# Patient Record
Sex: Female | Born: 1975 | Race: White | Hispanic: No | Marital: Single | Smoking: Never smoker
Health system: Southern US, Community
[De-identification: ages and names within clinical notes are randomized; demographics above are authoritative.]

## PROBLEM LIST (undated history)

## (undated) DIAGNOSIS — R000 Tachycardia, unspecified: Secondary | ICD-10-CM

## (undated) DIAGNOSIS — I498 Other specified cardiac arrhythmias: Secondary | ICD-10-CM

## (undated) DIAGNOSIS — R42 Dizziness and giddiness: Secondary | ICD-10-CM

## (undated) DIAGNOSIS — M1990 Unspecified osteoarthritis, unspecified site: Secondary | ICD-10-CM

## (undated) DIAGNOSIS — M940 Chondrocostal junction syndrome [Tietze]: Secondary | ICD-10-CM

## (undated) DIAGNOSIS — G43909 Migraine, unspecified, not intractable, without status migrainosus: Secondary | ICD-10-CM

## (undated) DIAGNOSIS — G90A Postural orthostatic tachycardia syndrome (POTS): Secondary | ICD-10-CM

## (undated) DIAGNOSIS — I499 Cardiac arrhythmia, unspecified: Secondary | ICD-10-CM

## (undated) DIAGNOSIS — G709 Myoneural disorder, unspecified: Secondary | ICD-10-CM

## (undated) DIAGNOSIS — G4733 Obstructive sleep apnea (adult) (pediatric): Secondary | ICD-10-CM

## (undated) DIAGNOSIS — R0602 Shortness of breath: Secondary | ICD-10-CM

## (undated) DIAGNOSIS — Z9889 Other specified postprocedural states: Secondary | ICD-10-CM

## (undated) DIAGNOSIS — I951 Orthostatic hypotension: Secondary | ICD-10-CM

## (undated) DIAGNOSIS — E669 Obesity, unspecified: Secondary | ICD-10-CM

## (undated) HISTORY — DX: Chondrocostal junction syndrome (tietze): M94.0

## (undated) HISTORY — PX: OTHER SURGICAL HISTORY: SHX169

## (undated) HISTORY — DX: Dizziness and giddiness: R42

## (undated) HISTORY — DX: Obesity, unspecified: E66.9

## (undated) HISTORY — PX: CARPAL TUNNEL RELEASE: SHX101

## (undated) HISTORY — DX: Tachycardia, unspecified: R00.0

## (undated) HISTORY — DX: Migraine, unspecified, not intractable, without status migrainosus: G43.909

## (undated) HISTORY — DX: Obstructive sleep apnea (adult) (pediatric): G47.33

---

## 2002-09-30 ENCOUNTER — Other Ambulatory Visit: Admission: RE | Admit: 2002-09-30 | Discharge: 2002-09-30 | Payer: Self-pay | Admitting: Obstetrics and Gynecology

## 2003-10-21 ENCOUNTER — Other Ambulatory Visit: Admission: RE | Admit: 2003-10-21 | Discharge: 2003-10-21 | Payer: Self-pay | Admitting: Obstetrics and Gynecology

## 2004-01-15 ENCOUNTER — Ambulatory Visit (HOSPITAL_COMMUNITY): Admission: RE | Admit: 2004-01-15 | Discharge: 2004-01-15 | Payer: Self-pay | Admitting: Occupational Therapy

## 2004-11-23 ENCOUNTER — Other Ambulatory Visit: Admission: RE | Admit: 2004-11-23 | Discharge: 2004-11-23 | Payer: Self-pay | Admitting: Obstetrics and Gynecology

## 2004-12-26 ENCOUNTER — Encounter: Admission: RE | Admit: 2004-12-26 | Discharge: 2004-12-26 | Payer: Self-pay | Admitting: Gastroenterology

## 2004-12-28 ENCOUNTER — Encounter: Admission: RE | Admit: 2004-12-28 | Discharge: 2004-12-28 | Payer: Self-pay | Admitting: Gastroenterology

## 2008-02-19 ENCOUNTER — Encounter: Admission: RE | Admit: 2008-02-19 | Discharge: 2008-02-19 | Payer: Self-pay | Admitting: Family Medicine

## 2008-04-23 ENCOUNTER — Encounter: Payer: Self-pay | Admitting: Orthopedic Surgery

## 2008-05-15 ENCOUNTER — Encounter: Payer: Self-pay | Admitting: Orthopedic Surgery

## 2008-06-15 ENCOUNTER — Encounter: Payer: Self-pay | Admitting: Orthopedic Surgery

## 2008-12-17 ENCOUNTER — Ambulatory Visit (HOSPITAL_COMMUNITY): Admission: RE | Admit: 2008-12-17 | Discharge: 2008-12-17 | Payer: Self-pay | Admitting: Gastroenterology

## 2008-12-18 ENCOUNTER — Encounter: Admission: RE | Admit: 2008-12-18 | Discharge: 2008-12-18 | Payer: Self-pay | Admitting: Gastroenterology

## 2008-12-22 ENCOUNTER — Encounter: Admission: RE | Admit: 2008-12-22 | Discharge: 2008-12-22 | Payer: Self-pay | Admitting: Gastroenterology

## 2009-01-15 ENCOUNTER — Encounter: Admission: RE | Admit: 2009-01-15 | Discharge: 2009-01-15 | Payer: Self-pay | Admitting: Gastroenterology

## 2009-02-15 ENCOUNTER — Encounter: Admission: RE | Admit: 2009-02-15 | Discharge: 2009-02-15 | Payer: Self-pay | Admitting: Gastroenterology

## 2009-03-19 ENCOUNTER — Ambulatory Visit (HOSPITAL_COMMUNITY): Admission: RE | Admit: 2009-03-19 | Discharge: 2009-03-19 | Payer: Self-pay | Admitting: Family Medicine

## 2009-04-21 ENCOUNTER — Encounter: Payer: Self-pay | Admitting: Internal Medicine

## 2009-04-30 ENCOUNTER — Encounter: Payer: Self-pay | Admitting: Internal Medicine

## 2009-05-03 ENCOUNTER — Encounter: Payer: Self-pay | Admitting: Internal Medicine

## 2009-05-15 HISTORY — PX: REDUCTION MAMMAPLASTY: SUR839

## 2009-05-31 ENCOUNTER — Encounter: Payer: Self-pay | Admitting: Internal Medicine

## 2009-07-09 ENCOUNTER — Encounter: Payer: Self-pay | Admitting: Internal Medicine

## 2009-07-15 ENCOUNTER — Encounter: Payer: Self-pay | Admitting: Internal Medicine

## 2009-08-03 DIAGNOSIS — I10 Essential (primary) hypertension: Secondary | ICD-10-CM | POA: Insufficient documentation

## 2009-08-04 ENCOUNTER — Ambulatory Visit: Payer: Self-pay | Admitting: Internal Medicine

## 2009-08-04 DIAGNOSIS — I498 Other specified cardiac arrhythmias: Secondary | ICD-10-CM

## 2009-08-11 ENCOUNTER — Telehealth: Payer: Self-pay | Admitting: Internal Medicine

## 2009-08-16 ENCOUNTER — Telehealth: Payer: Self-pay | Admitting: Internal Medicine

## 2009-08-16 ENCOUNTER — Ambulatory Visit: Payer: Self-pay | Admitting: Internal Medicine

## 2009-08-23 ENCOUNTER — Telehealth: Payer: Self-pay | Admitting: Internal Medicine

## 2009-08-27 ENCOUNTER — Ambulatory Visit: Payer: Self-pay

## 2009-09-08 ENCOUNTER — Ambulatory Visit: Payer: Self-pay | Admitting: Internal Medicine

## 2009-09-08 DIAGNOSIS — I951 Orthostatic hypotension: Secondary | ICD-10-CM | POA: Insufficient documentation

## 2009-09-08 DIAGNOSIS — G4733 Obstructive sleep apnea (adult) (pediatric): Secondary | ICD-10-CM

## 2009-09-16 ENCOUNTER — Telehealth: Payer: Self-pay | Admitting: Internal Medicine

## 2009-10-04 ENCOUNTER — Ambulatory Visit: Payer: Self-pay | Admitting: Internal Medicine

## 2009-10-06 ENCOUNTER — Telehealth: Payer: Self-pay | Admitting: Internal Medicine

## 2009-10-15 ENCOUNTER — Encounter: Admission: RE | Admit: 2009-10-15 | Discharge: 2009-10-15 | Payer: Self-pay | Admitting: Obstetrics and Gynecology

## 2009-10-20 ENCOUNTER — Telehealth: Payer: Self-pay | Admitting: Internal Medicine

## 2009-11-24 ENCOUNTER — Ambulatory Visit: Payer: Self-pay | Admitting: Internal Medicine

## 2009-11-24 DIAGNOSIS — R0789 Other chest pain: Secondary | ICD-10-CM | POA: Insufficient documentation

## 2009-11-26 ENCOUNTER — Telehealth: Payer: Self-pay | Admitting: Internal Medicine

## 2009-12-16 ENCOUNTER — Ambulatory Visit (HOSPITAL_COMMUNITY): Admission: RE | Admit: 2009-12-16 | Discharge: 2009-12-17 | Payer: Self-pay | Admitting: Plastic Surgery

## 2009-12-16 ENCOUNTER — Encounter (INDEPENDENT_AMBULATORY_CARE_PROVIDER_SITE_OTHER): Payer: Self-pay | Admitting: Plastic Surgery

## 2010-02-17 ENCOUNTER — Ambulatory Visit: Payer: Self-pay | Admitting: Internal Medicine

## 2010-06-05 ENCOUNTER — Encounter: Payer: Self-pay | Admitting: Gastroenterology

## 2010-06-12 LAB — CONVERTED CEMR LAB
BUN: 14 mg/dL (ref 6–23)
CO2: 28 meq/L (ref 19–32)
Calcium: 9.4 mg/dL (ref 8.4–10.5)
Chloride: 104 meq/L (ref 96–112)
Glucose, Bld: 75 mg/dL (ref 70–99)
Hgb A1c MFr Bld: 5.6 % (ref 4.6–6.5)
Potassium: 4 meq/L (ref 3.5–5.1)
Sodium: 139 meq/L (ref 135–145)

## 2010-06-14 NOTE — Cardiovascular Report (Signed)
Summary: Hand Drawn Heart Pic  Hand Drawn Heart Pic   Imported By: Roderic Ovens 09/06/2009 11:36:41  _____________________________________________________________________  External Attachment:    Type:   Image     Comment:   External Document

## 2010-06-14 NOTE — Assessment & Plan Note (Signed)
Summary: PER CHECK OUT/SF   Referring Provider:  Michele Rockers, MD Primary Provider:  Dr Clovis Riley   History of Present Illness: The patient presents today for routine electrophysiology followup. She reports doing very well since last being seen in our clinic.  Her unsteadiness and dizziness have resolved with fludricordisone.  Sinus tachycardia is also better.  She reports rare sharp chest discomfort  lasting only several seconds at rest.  The patient denies symptoms of shortness of breath, orthopnea, PND, lower extremity edema, presyncope, syncope, or neurologic sequela.  Her daytime somnolence and fatigue and signicicantly improved with CPAP.  The patient is tolerating medications without difficulties and is otherwise without complaint today.   Current Medications (verified): 1)  Fludrocortisone Acetate 0.1 Mg Tabs (Fludrocortisone Acetate) .... Take One Twice Daily 2)  Cymbalta 30 Mg Cpep (Duloxetine Hcl) .Marland Kitchen.. 1 Capsule  Once Daily 3)  Depo-Provera 150 Mg/ml Susp (Medroxyprogesterone Acetate) .... Uad 4)  Carvedilol 12.5 Mg Tabs (Carvedilol) .... Take One Tablet By Mouth Every Night 5)  Anaprox Ds 550 Mg Tabs (Naproxen Sodium) .... Two Times A Day 6)  Hydrocodone-Acetaminophen 5-325 Mg Tabs (Hydrocodone-Acetaminophen) .... As Needed 7)  Stool Softener 100 Mg Caps (Docusate Sodium) .... Uad 8)  Vita-Plus E 400 Unit Caps (Vitamin E) .... Once Daily  Allergies: 1)  ! * Illison 2)  ! Toradol  Past History:  Past Medical History: Reviewed history from 09/08/2009 and no changes required. Migraines costochondritis sinus tachycardia obesity orthostatic dizziness mild OSA  Social History: Reviewed history from 08/04/2009 and no changes required. Pt lives in [place] with spouse.  She has no children. Works at the [employer] for [employer]. Tobacco Use - No.  Alcohol Use - rare Regular Exercise - no Drug Use - no  Review of Systems       All systems are reviewed and  negative except as listed in the HPI.   Vital Signs:  Patient profile:   35 year old female Height:      68 inches Weight:      264 pounds BMI:     40.29 Pulse rate:   66 / minute BP sitting:   148 / 86  (left arm)  Vitals Entered By: Laurance Flatten CMA (November 24, 2009 1:50 PM)  Physical Exam  General:  obese, NAD Head:  normocephalic and atraumatic Eyes:  PERRLA/EOM intact; conjunctiva and lids normal. Mouth:  Teeth, gums and palate normal. Oral mucosa normal. Neck:  Neck supple, no JVD. No masses, thyromegaly or abnormal cervical nodes. Lungs:  Clear bilaterally to auscultation and percussion. Heart:  Non-displaced PMI, chest non-tender; regular rate and rhythm, S1, S2 without murmurs, rubs or gallops. Carotid upstroke normal, no bruit. Normal abdominal aortic size, no bruits. Femorals normal pulses, no bruits. Pedals normal pulses. No edema, no varicosities. Abdomen:  Bowel sounds positive; abdomen soft and non-tender without masses, organomegaly, or hernias noted. No hepatosplenomegaly. Msk:  Back normal, normal gait. Muscle strength and tone normal. Pulses:  pulses normal in all 4 extremities Extremities:  No clubbing or cyanosis.  no edema Neurologic:  Alert and oriented x 3.   Impression & Recommendations:  Problem # 1:  SINUS TACHYCARDIA (ICD-427.89) stable no changes today  Problem # 2:  ORTHOSTATIC HYPOTENSION (ICD-458.0) resolved with fludrocortisone we will wean fludrocortisone in the fall if she continues to do well  Problem # 3:  HYPERTENSION (ICD-401.9) stable I would prefer that she be mildly hypertensive than symptomatically hypotensive at this time no changes  today  Problem # 4:  OBSTRUCTIVE SLEEP APNEA (ICD-327.23) symptoms have significantly improved with CPAP  Problem # 5:  CHEST PAIN, ATYPICAL (ICD-786.59) stable she has breast augmentation scheduled.  I would recommend that she proceed to surgery on her current medicines without further cardiac  evaluation.  Patient Instructions: 1)  Your physician recommends that you schedule a follow-up appointment in: 3 months with Dr Johney Frame Prescriptions: FLUDROCORTISONE ACETATE 0.1 MG TABS (FLUDROCORTISONE ACETATE) Take one twice daily  #60 x 3   Entered by:   Dennis Bast, RN, BSN   Authorized by:   Hillis Range, MD   Signed by:   Dennis Bast, RN, BSN on 11/24/2009   Method used:   Electronically to        [facility], [facility] (retail)       [address]       [place], [place]  [address]       Ph: [phone]       Fax: [phone]   RxID:   [id number]

## 2010-06-14 NOTE — Assessment & Plan Note (Signed)
Summary: 2:45/per check out/sf   Visit Type:  Follow-up Referring Provider:  Michele Rockers, MD Primary Provider:  Dr Clovis Riley   History of Present Illness: The patient presents today for routine electrophysiology followup. She reports doing reasonably well since last being seen in our clinic.  Her unsteadiness and dizziness have significantly improved.  Sinus tachycardia is also better.  She reports rare sharp chest discomfort  lasting only several seconds at rest.  The patient denies symptoms of shortness of breath, orthopnea, PND, lower extremity edema, presyncope, syncope, or neurologic sequela. The patient is tolerating medications without difficulties and is otherwise without complaint today.   Current Medications (verified): 1)  Imipramine Hcl 25 Mg Tabs (Imipramine Hcl) .... At Bedtime 2)  Fludrocortisone Acetate 0.1 Mg Tabs (Fludrocortisone Acetate) .... Take One Twice Daily 3)  Cymbalta 30 Mg Cpep (Duloxetine Hcl) .Marland Kitchen.. 1 Capsule  Once Daily 4)  Depo-Provera 150 Mg/ml Susp (Medroxyprogesterone Acetate) .... Uad  Allergies (verified): 1)  ! * Illison 2)  ! Toradol  Past History:  Past Medical History: Reviewed history from 09/08/2009 and no changes required. Migraines costochondritis sinus tachycardia obesity orthostatic dizziness mild OSA  Past Surgical History: Reviewed history from 08/04/2009 and no changes required. bladder stem shortening at age 73  Social History: Reviewed history from 08/04/2009 and no changes required. Pt lives in [place] with spouse.  She has no children. Works at the [employer] for [employer]. Tobacco Use - No.  Alcohol Use - rare Regular Exercise - no Drug Use - no  Review of Systems       All systems are reviewed and negative except as listed in the HPI.   Vital Signs:  Patient profile:   35 year old female Height:      68 inches Weight:      269 pounds BMI:     41.05 Pulse rate:   94 / minute BP sitting:   98 / 72   (left arm)  Vitals Entered By: Laurance Flatten CMA (Oct 04, 2009 3:15 PM)  Physical Exam  General:  obese, NAD Head:  normocephalic and atraumatic Eyes:  PERRLA/EOM intact; conjunctiva and lids normal. Mouth:  Teeth, gums and palate normal. Oral mucosa normal. Neck:  Neck supple, no JVD. No masses, thyromegaly or abnormal cervical nodes. Lungs:  Clear bilaterally to auscultation and percussion. Heart:  Non-displaced PMI, chest non-tender; regular rate and rhythm, S1, S2 without murmurs, rubs or gallops. Carotid upstroke normal, no bruit. Normal abdominal aortic size, no bruits. Femorals normal pulses, no bruits. Pedals normal pulses. No edema, no varicosities. Abdomen:  Bowel sounds positive; abdomen soft and non-tender without masses, organomegaly, or hernias noted. No hepatosplenomegaly. Msk:  Back normal, normal gait. Muscle strength and tone normal. Pulses:  pulses normal in all 4 extremities Extremities:  No clubbing or cyanosis.  no edema Neurologic:  Alert and oriented x 3. Skin:  Intact without lesions or rashes. Psych:  Normal affect.   EKG  Procedure date:  10/04/2009  Findings:      sinus rhythm 94 bpm, otherwise normal ekg  Impression & Recommendations:  Problem # 1:  ORTHOSTATIC HYPOTENSION (ICD-458.0) improving with florinef no changes today supportive care encouraged  Problem # 2:  SINUS TACHYCARDIA (ICD-427.89) stable decrease coreg to 12.5mg  at night could switch to nadolol if sinus tach continues to be a problem  Problem # 3:  HYPERTENSION (ICD-401.9) stable  Patient Instructions: 1)  Your physician recommends that you schedule a follow-up appointment in: 2  months with Dr Johney Frame 2)  Your physician has recommended you make the following change in your medication: decrease Coreg TO 12.5MG  at night  Appended Document: 2:45/per check out/sf Pt to follow-up with Dr Anne Fu for results of recent sleep study weight loss is encouraged

## 2010-06-14 NOTE — Assessment & Plan Note (Signed)
Summary: nep/tachycardia/jml   Visit Type:  Initial Consult Referring Provider:  Michele Rockers, MD Primary Provider:  Dr Clovis Riley  CC:  tachycardia.  History of Present Illness: Julia Haas is a pleasant 35 yo WF with a h/o sinus tachycardia who presents today for EP consultation. She reports doing well until 11/10 when she began to notice "heart racing" with associated SOB and fatigue.  She went to urgent care and was found to have sinus tachycardia and an elevated BP.  She subsequently has been evaluated by Dr Anne Fu.  Per Dr Anne Fu office record, she had a chesgt CT which  did not show PTE.  She also had an echocardiogram 04/21/09 which revealed showed normal LV function without any significant abnormalities.  She had a holter monitor placed 04/21/09-04/23/09 which revealed sinus rhythm with frequent sinus tachycardia.  Her average HR was 103 (range was 86-156 bpm).  No sustained arrhythmias were observed.  She has been treated with verapamil 240mg  daily as well as coreg 25mg  two times a day.  Her blood pressure has improved, though she continues to have sinus tachycardia.  She has also tried diltiazem DC 240mg  daily and metoprolol 50mg  two times a day.  She reports dry throat with metoprolol and daytime somnolence.  The diltiazem caused headache and fatigue.  She has stopped taking imipramine (which she took for migraines) without improvement. Presently, she reports feeling reasonably well.  She has nasal congestion and symptoms of a URI.  She continues to have fatigue and palpitations with activity.  She also feels a sharp lancenating pain in her chest when her heart rate is fast.   She reports occasional dizziness, worse when she rises from a  seated position. She also reports SOB primarliy when bending over. She does not feel rested in the morning and is sleepy throughout the day.  She snores, though her spouse is unaware of sleep apnea. The patient denies symptoms of orthopnea, PND, lower extremity  edema, presyncope, syncope, or neurologic sequela. The patient is tolerating medications without difficulties and is otherwise without complaint today.   Current Medications (verified): 1)  Verapamil Hcl Cr 240 Mg Xr24h-Cap (Verapamil Hcl) .... Take One Capsule By Mouth Daily 2)  Coreg Cr 40 Mg Xr24h-Cap (Carvedilol Phosphate) .... Take One Tablet By Mouth Twice Daily. 3)  Amoxicillin 875 Mg Tabs (Amoxicillin) .... Two Times A Day  Allergies (verified): 1)  ! * Illison 2)  ! 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  She has no children. Works at the [employer] for [employer]. Tobacco Use - No.  Alcohol Use - rare Regular Exercise - no Drug Use - no  Review of Systems       All systems are reviewed and negative except as listed in the HPI.   Vital Signs:  Patient profile:   35 year old female Height:      68 inches Weight:      252 pounds BMI:     38.45 Pulse rate:   84 / minute BP sitting:   96 / 60  (left arm)  Vitals Entered By: Laurance Flatten CMA (August 04, 2009 11:33 AM)  Serial  Vital Signs/Assessments:  Time      Position  BP       Pulse  Resp  Temp     By           Lying RA  100/76   81                    Jewel Hardy CMA           Sitting   101/72   0                     Jewel Hardy CMA           Standing  107/77   0                     Jewel Hardy CMA           Standing  101/77   92                    Jewel Hardy CMA  Comments: pulse #2 - 83 pulse #3 - 88  During reading #5 pt became dizzy and was unable to remain standing. By: Laurance Flatten CMA    Physical Exam  General:  obese, NAD Head:  normocephalic and  atraumatic Eyes:  PERRLA/EOM intact; conjunctiva and lids normal. Mouth:  Teeth, gums and palate normal. Oral mucosa normal. Neck:  Neck supple, no JVD. No masses, thyromegaly or abnormal cervical nodes. Lungs:  Clear bilaterally to auscultation and percussion. Heart:  Non-displaced PMI, chest non-tender; regular rate and rhythm, S1, S2 without murmurs, rubs or gallops. Carotid upstroke normal, no bruit. Normal abdominal aortic size, no bruits. Femorals normal pulses, no bruits. Pedals normal pulses. No edema, no varicosities. Abdomen:  Bowel sounds positive; abdomen soft and non-tender without masses, organomegaly, or hernias noted. No hepatosplenomegaly. Msk:  Back normal, normal gait. Muscle strength and tone normal. Pulses:  pulses normal in all 4 extremities Extremities:  No clubbing or cyanosis. Neurologic:  Alert and oriented x 3. Skin:  Intact without lesions or rashes. Cervical Nodes:  no significant adenopathy Psych:  Normal affect.   Echocardiogram  Procedure date:  04/21/2009  Findings:       Normal LV size and function. There is no gross regional wall motion abnormalities, although sensitivity is reduced by difficult windows. Left ventricular ejection fraction estimated by 2D at 60-65 percent.  LA 3.3cm  Donato Schultz, MD  CT Scan  Procedure date:  02/15/2009  Findings:        Findings:  No filling defects in the pulmonary arteries to suggest   pulmonary emboli. Lungs are clear.  No focal airspace opacities or   suspicious nodules.  No effusions. Heart is normal size. Aorta is   normal caliber. No mediastinal, hilar, or axillary adenopathy.   Visualized thyroid and chest wall soft tissues unremarkable.    Imaging into the upper abdomen shows no acute findings.    No acute bony abnormality.    Review of the MIP images confirms the above findings.    IMPRESSION:   No evidence of pulmonary embolus.    No acute findings.    Read By:  Charlett Nose,   M.D.     Impression & Recommendations:  Problem # 1:  SINUS TACHYCARDIA (ICD-427.89) The patient has symptomatic sinus tachycardia.  I think that her sinus tachycardia is likely a secondary process, and is unlikely primary inappropriate sinus tachycardia.  She brings a log of recent heart rates, which are mostly < 100 bpm.  Her main complaint today is fatigue and also daytime somnolence.  She snores and does not feel well rested in the AM.  I have encouraged her to follow-up with Dr Anne Fu for a sleep study referral. In addition, she becomes dizzy upon standing and has HR elevation when checking orthostatics.  I think that chronic dehydration could be contributing to her symptoms.  I have recommended salt liberalization with aggressive hydration.  If her heart rates remain less than 100 for most of the time, then we will consider weaning coreg upon return.  Problem # 2:  HYPERTENSION (ICD-401.9) presently well controlled with verapamil and coreg weight loss and lifestyle modifcation would be helpful.  Her updated medication list for this problem includes:    Verapamil Hcl Cr 240 Mg Xr24h-cap (Verapamil hcl) .Marland Kitchen... Take one capsule by mouth daily    Coreg Cr 40 Mg Xr24h-cap (Carvedilol phosphate) .Marland Kitchen... Take one tablet by mouth twice daily.  Patient Instructions: 1)  Your physician recommends that you schedule a follow-up appointment in: 3 weeks with Dr Johney Frame 2)  Call Dr Anne Fu office to set up sleep study 3)  Stay hydrated and increase SALT intake

## 2010-06-14 NOTE — Progress Notes (Signed)
Summary: dizzy/fell  Phone Note Call from Patient Call back at Home Phone [phone]   Caller: Patient Reason for Call: Talk to Doctor Summary of Call: pt is still very dizzy headed and has fell twice this weekend, drinking 60-75 oz daily Initial call taken by: Migdalia Dk,  August 16, 2009 8:09 AM  Follow-up for Phone Call        [facility] for pt to call me back Dennis Bast, RN, BSN  August 16, 2009 8:47 AM will come in today and discuss with Dr Johney Frame Dennis Bast, RN, BSN  August 16, 2009 12:11 PM

## 2010-06-14 NOTE — Progress Notes (Signed)
Summary: steroid/heart rate  Phone Note Call from Patient Call back at Home Phone [phone]   Caller: Patient Reason for Call: Talk to Nurse Summary of Call: thinks that the steroid is not helping, still swimmy headed, elevated heart rate Initial call taken by: Migdalia Dk,  August 23, 2009 8:15 AM  Follow-up for Phone Call        spoke with pt and she feels she is about the same. HR still up Drinking lots of fluids and eatting lots of salt taking Florinef 0.1mg  two times a day.  When she is sitting she feels a little swimmy headed.  When up moving around it's the worse  "feels like she is on a boat and the waves are moving her." Pt calling back request call Judie Grieve  August 26, 2009 1:24 PM  PER PT CALLED BACK TO [place] WITH KELLY [phone] Lorne Skeens  August 26, 2009 4:50 PM   Additional Follow-up for Phone Call Additional follow up Details #1::        Will add on to nurse room for orthostatic BP check Pam aware.  If still orthostatic will increase Forinef to .2mg  two times a day and take for at least 10 days before she calls me to tell me how she is feeling.  If still not any better will refer to Neuro per Dr Johney Frame Dennis Bast, RN, BSN  August 26, 2009 6:24 PM Called pt on her cell phone and [facility] [phone] Dennis Bast, RN, BSN  August 26, 2009 6:28 PM

## 2010-06-14 NOTE — Progress Notes (Signed)
Summary: pt wants to discuss surgery she needs   Phone Note Call from Patient Call back at Home Phone [phone]   Caller: Patient Reason for Call: Talk to Nurse, Talk to Doctor Summary of Call: per pt call pt needs surgery and the MD that is doing it wants to get her b/p under control and she want sto discuss that with you. Number provided is long distnace you have to put the 336 in front. Initial call taken by: Omer Jack,  October 20, 2009 12:06 PM  Follow-up for Phone Call        Pt calling back regarding taking a b/p medication Judie Grieve  October 22, 2009 8:05 AM  pt request call back asap, states this is very important, Migdalia Dk  October 22, 2009 8:23 AM   Additional Follow-up for Phone Call Additional follow up Details #1::        I spoke with the pt and she is scheduled for a breast reduction in July with Dr Etter Sjogren.  Dr Odis Luster said he would not do surgery until her BP is under better control.  The pt does monitor her BP at home and her readings this week were  119/94, HR 83, 141/106, HR 104 and 124/94, HR 81.  The pt would like to know what needs to be done for her BP so that she can have her surgery.  Tresa Endo spoke with Dr Johney Frame about this pt and left a message for the pt to call back.  Additional Follow-up by: Julieta Gutting, RN, BSN,  October 22, 2009 3:29 PM    Additional Follow-up for Phone Call Additional follow up Details #2::    End of July for surgery.  Let her know that Dr Johney Frame will call Dr Odis Luster after he returns from vacation.  She is fine with that and knows Dr Johney Frame is out all week. Dennis Bast, RN, BSN  October 25, 2009 9:52 AM  Additional Follow-up for Phone Call Additional follow up Details #3:: Details for Additional Follow-up Action Taken: we can dictate a letter to Dr Odis Luster or call his office.  Sent Dr Johney Frame a remionder to call Dr Odis Luster regarding pr and her upcoming surgery Dennis Bast, RN, BSN  November 22, 2009 9:36 AM

## 2010-06-14 NOTE — Progress Notes (Signed)
Summary: dizziness/med change needed?  Phone Note Call from Patient Call back at Home Phone [phone]   Caller: Patient Reason for Call: Talk to Nurse Summary of Call: Patient is experiencing dizziness.  Med change? Initial call taken by: Burnard Leigh,  August 11, 2009 8:45 AM  Follow-up for Phone Call        started Sun. this is new.  ? d/cing one of the BP meds  Discussed with Dr Johney Frame pt to decrease Coreg to 20mg  daily and increase fluids and salt and keep follow up appointment.  She understands and will call if having more problems Dennis Bast, RN, BSN  August 11, 2009 1:11 PM

## 2010-06-14 NOTE — Progress Notes (Signed)
Summary: mammogram  Phone Note Call from Patient Call back at Home Phone [phone]   Caller: Patient Reason for Call: Talk to Nurse Summary of Call: calling to see if appt has been set up for mammogram Initial call taken by: Migdalia Dk,  Oct 06, 2009 3:16 PM  Follow-up for Phone Call        [facility] w Dr Lynnell Dike nurse Amy Chestine Spore.  please call patient to set up a mamogram or appoinment for this patient regarding fibrocystic breast per Dr Johney Frame Dennis Bast, RN, BSN  Oct 07, 2009 3:43 PM

## 2010-06-14 NOTE — Assessment & Plan Note (Signed)
Summary: APPT. AT 0915A/OK PER JAMIE/D.MILLER   Referring Provider:  Michele Rockers, MD Primary Provider:  Dr Clovis Riley   History of Present Illness: The patient presents today for routine electrophysiology followup. She reports doing reasonably well since last being seen in our clinic.  Her unsteadiness and dizziness have improved.  She continues to have sinus tachycardia with activity.  She reports rare dull chest discomfort when her heart rates are elevated.  The patient denies symptoms of shortness of breath, orthopnea, PND, lower extremity edema, presyncope, syncope, or neurologic sequela. The patient is tolerating medications without difficulties and is otherwise without complaint today.   Current Medications (verified): 1)  Verapamil Hcl Cr 240 Mg Xr24h-Cap (Verapamil Hcl) .... Take One Capsule By Mouth Daily 2)  Carvedilol 25 Mg Tabs (Carvedilol) .... Take One Tablet By Mouth Once Daily 3)  Imipramine Hcl 25 Mg Tabs (Imipramine Hcl) .... At Bedtime 4)  Fludrocortisone Acetate 0.1 Mg Tabs (Fludrocortisone Acetate) .... Take One Twice Daily  Allergies (verified): 1)  ! * Illison 2)  ! Toradol  Past History:  Past Medical History: Migraines costochondritis sinus tachycardia obesity orthostatic dizziness mild OSA  Past Surgical History: Reviewed history from 08/04/2009 and no changes required. bladder stem shortening at age 63  Social History: Reviewed history from 08/04/2009 and no changes required. Pt lives in [place] with spouse.  She has no children. Works at the [employer] for [employer]. Tobacco Use - No.  Alcohol Use - rare Regular Exercise - no Drug Use - no  Review of Systems       All systems are reviewed and negative except as listed in the HPI.   Vital Signs:  Patient profile:   35 year old female Height:      68 inches Weight:      260 pounds BMI:     39.68 Pulse rate:   85 / minute BP sitting:   100 / 80  (left arm)  Vitals Entered By:  Laurance Flatten CMA (September 08, 2009 9:01 AM)  Physical Exam  General:  obese, NAD Head:  normocephalic and atraumatic Eyes:  PERRLA/EOM intact; conjunctiva and lids normal. Mouth:  Teeth, gums and palate normal. Oral mucosa normal. Neck:  Neck supple, no JVD. No masses, thyromegaly or abnormal cervical nodes. Lungs:  Clear bilaterally to auscultation and percussion. Heart:  Non-displaced PMI, chest non-tender; regular rate and rhythm, S1, S2 without murmurs, rubs or gallops. Carotid upstroke normal, no bruit. Normal abdominal aortic size, no bruits. Femorals normal pulses, no bruits. Pedals normal pulses. No edema, no varicosities. Abdomen:  Bowel sounds positive; abdomen soft and non-tender without masses, organomegaly, or hernias noted. No hepatosplenomegaly. Msk:  Back normal, normal gait. Muscle strength and tone normal. Pulses:  pulses normal in all 4 extremities Extremities:  No clubbing or cyanosis. Neurologic:  Alert and oriented x 3. Skin:  Intact without lesions or rashes. Psych:  Normal affect.   Impression & Recommendations:  Problem # 1:  SINUS TACHYCARDIA (ICD-427.89) stable will reduce verapamil to 120mg  daily and then stop if rhythm is stable will consider switching coreg to nadolol upon return  Problem # 2:  ORTHOSTATIC HYPOTENSION (ICD-458.0) improving with florinef I have encouraged pt to stop imipramine consider increasing florinef upon return  Problem # 3:  OBSTRUCTIVE SLEEP APNEA (ICD-327.23) newly diagnosed sleep apnea weight loss advised she will have CPAP trial through Dr Anne Fu office.  Patient Instructions: 1)  Your physician recommends that you schedule a follow-up appointment in:  4 weeks. 2)  Your physician recommends that you continue on your current medications as directed. Please refer to the Current Medication list given to you today.

## 2010-06-14 NOTE — Assessment & Plan Note (Signed)
Summary: Add-on orthostatic hypotension/kl   Current Medications (verified): 1)  Verapamil Hcl Cr 240 Mg Xr24h-Cap (Verapamil Hcl) .... Take One Capsule By Mouth Daily 2)  Carvedilol 25 Mg Tabs (Carvedilol) .... Take One Tablet By Mouth Once Daily 3)  Imipramine Hcl 25 Mg Tabs (Imipramine Hcl) .... At Bedtime  Allergies: 1)  ! * Illison 2)  ! Toradol  Past History:  Past Medical History: Last updated: 08/04/2009 Migraines costochondritis sinus tachycardia obesity  Vital Signs:  Patient profile:   35 year old female Height:      68 inches Weight:      264 pounds BMI:     40.29 Pulse rate:   75 / minute Pulse (ortho):   100 / minute BP standing:   114 / 92  Vitals Entered By: Laurance Flatten CMA (August 16, 2009 2:18 PM)  Serial Vital Signs/Assessments:  Time      Position  BP       Pulse  Resp  Temp     By           Lying LA  110/90   86                    Jewel Hardy CMA           Sitting   122/96   92                    Jewel Hardy CMA           Standing  114/92   100                   Jewel Hardy CMA           Standing  110/90   97                    Jewel Hardy CMA           Standing  100/80   104                   Laurance Flatten CMA    Patient Instructions: 1)  Your physician recommends that you schedule a follow-up appointment in: as scheduled. 2)  Your physician has recommended you make the following change in your medication: Florinef 0.1mg  twice a day Prescriptions: FLUDROCORTISONE ACETATE 0.1 MG TABS (FLUDROCORTISONE ACETATE) Take one twice daily  #60 x 3   Entered by:   Lisabeth Devoid RN   Authorized by:   Hillis Range, MD   Signed by:   Lisabeth Devoid RN on 08/16/2009   Method used:   Electronically to        [facility] (retail)       [address]       [place], [place]  [address]       Ph: [phone]       Fax: [phone]   RxID:   [id number]   Appended Document: Add-on orthostatic hypotension/kl Pt presented today  for a nurse visit.  She remains orthostatic by HR and BP despite increased salt and water intake.  She has significant symptoms of orthostasis.  I have therefore recommended florinef 0.1mg  two times a day.  I will see her in follow-up as previously scheduled.  We will consider further testing pending how she is feeling at that time.

## 2010-06-14 NOTE — Letter (Signed)
[facility]  [facility] Progress Note   Imported By: Roderic Ovens 09/06/2009 11:36:11  _____________________________________________________________________  External Attachment:    Type:   Image     Comment:   External Document

## 2010-06-14 NOTE — Assessment & Plan Note (Signed)
Summary: per check otu/sf   Visit Type:  Follow-up Referring Provider:  Michele Rockers, MD Primary Provider:  Dr Clovis Riley   History of Present Illness: The patient presents today for routine electrophysiology followup. She reports doing very well since last being seen in our clinic.  Her unsteadiness and dizziness have resolved with fludricordisone.  Sinus tachycardia is also better.  Her chest pain resolved with recent breast augmentation. The patient denies symptoms of shortness of breath, orthopnea, PND, lower extremity edema, presyncope, syncope, or neurologic sequela.  Her daytime somnolence and fatigue and signicicantly improved with CPAP.   She continues to have trouble with weight loss. The patient is tolerating medications without difficulties and is otherwise without complaint today.   Current Medications (verified): 1)  Fludrocortisone Acetate 0.1 Mg Tabs (Fludrocortisone Acetate) .... Take One Twice Daily 2)  Cymbalta 30 Mg Cpep (Duloxetine Hcl) .Marland Kitchen.. 1 Capsule  Once Daily 3)  Depo-Provera 150 Mg/ml Susp (Medroxyprogesterone Acetate) .... Uad 4)  Carvedilol 12.5 Mg Tabs (Carvedilol) .... Take One Tablet By Mouth Every Night 5)  Stool Softener 100 Mg Caps (Docusate Sodium) .... Uad 6)  Advil 200 Mg Tabs (Ibuprofen) .... Uad  Allergies (verified): 1)  ! * Illison 2)  ! Toradol  Past History:  Past Medical History: Reviewed history from 09/08/2009 and no changes required. 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 Pt lives in [place] with spouse.  She has no children. Works at the [employer] for [employer]. Tobacco Use - No.  Alcohol Use - rare Regular Exercise - no Drug Use - no  Review of Systems       All systems are reviewed and negative except  as listed in the HPI.   Vital Signs:  Patient profile:   35 year old female Height:      68 inches Weight:      265 pounds BMI:     40.44 Pulse rate:   80 / minute BP sitting:   140 / 102  (left arm)  Vitals Entered By: Laurance Flatten CMA (February 17, 2010 1:53 PM)  Physical Exam  General:  Well developed, well nourished, in no acute distress. Head:  normocephalic and atraumatic Eyes:  PERRLA/EOM intact; conjunctiva and lids normal. Mouth:  Teeth, gums and palate normal. Oral mucosa normal. Neck:  Neck supple, no JVD. No masses, thyromegaly or abnormal cervical nodes. Lungs:  Clear bilaterally to auscultation and percussion. Heart:  Non-displaced PMI, chest non-tender; regular rate and rhythm, S1, S2 without murmurs, rubs or gallops. Carotid upstroke normal, no bruit. Normal abdominal aortic size, no bruits. Femorals normal pulses, no bruits. Pedals normal pulses. No edema, no varicosities. Abdomen:  Bowel sounds positive; abdomen soft and non-tender without masses, organomegaly, or hernias noted. No hepatosplenomegaly. Msk:  Back normal, normal gait. Muscle strength and tone normal. Pulses:  pulses normal in all 4 extremities Extremities:  No clubbing or cyanosis. Neurologic:  Alert and oriented x 3.   EKG  Procedure date:  02/17/2010  Findings:      sinus rhythm 80 bpm, nl ekg  Impression & Recommendations:  Problem # 1:  CHEST PAIN, ATYPICAL (ICD-786.59) resolved s/p breast augmentation  Problem # 2:  ORTHOSTATIC HYPOTENSION (ICD-458.0) improved decrease florinef to 0.1mg  daily  Problem # 3:  HYPERTENSION (ICD-401.9) decrease flornef as above  Problem # 4:  SINUS TACHYCARDIA (ICD-427.89) continue coreg consider switching to once daily nadolol if stable  Problem # 5:  OBSTRUCTIVE SLEEP APNEA (ICD-327.23) continue cpap  the importance of weight loss was discussed with pt today  Patient Instructions: 1)  Your physician recommends that you schedule a follow-up  appointment in: 4 months with Dr Johney Frame 2)  Your physician has recommended you make the following change in your medication: Decrease Fludrocortisone 0.1mg  to one tablet daily  3)  Your physician recommends that you return for lab work in today Prescriptions: CARVEDILOL 12.5 MG TABS (CARVEDILOL) Take one tablet by mouth EVERY NIGHT  #30 x 11   Entered by:   Dennis Bast, RN, BSN   Authorized by:   Hillis Range, MD   Signed by:   Dennis Bast, RN, BSN on 02/17/2010   Method used:   Electronically to        [facility] (retail)       [address]       [place], [place]  [address]       Ph: [phone]       Fax: [phone]   RxID:   [id number]   Appended Document: per check otu/sf we will check BMET and A1C today

## 2010-06-14 NOTE — Letter (Signed)
[facility] Office Note  [facility] Office Note   Imported By: Roderic Ovens 09/06/2009 11:35:40  _____________________________________________________________________  External Attachment:    Type:   Image     Comment:   External Document

## 2010-06-14 NOTE — Progress Notes (Signed)
Summary: B/P high  Phone Note Call from Patient Call back at Home Phone [phone]   Caller: Patient Summary of Call: Pt calling regarding her b/p 135/92 last night and today 132/103 both took in left arm Initial call taken by: Judie Grieve,  Sep 16, 2009 10:52 AM  Follow-up for Phone Call        128/98 HR 90 feels ok just worried what will she do when she stops the Verapamil all together.  Will discuss with Dr Johney Frame and call the pt back. Dennis Bast, RN, BSN  Sep 16, 2009 11:09 AM  returning call, Migdalia Dk  Sep 17, 2009 12:07 PM  spoke with Dr Johney Frame not worried about those BP's  continue with plan Dennis Bast, RN, BSN  Sep 17, 2009 2:17 PM

## 2010-06-14 NOTE — Progress Notes (Signed)
Summary: pt needs letter for surgery  Phone Note Call from Patient Call back at Home Phone [phone]   Caller: Patient Reason for Call: Talk to Nurse, Talk to Doctor Summary of Call: pt has a letter in the system already that needs to be sent to Dr. Odis Luster at fax# [phone] so she can get her surgery Dr. Johney Frame told her he had it ready. Initial call taken by: Omer Jack,  November 26, 2009 10:55 AM  Follow-up for Phone Call        faxed letter to Dr Odis Luster office and called pt to let her know it was faxed Follow-up by: Charolotte Capuchin, RN,  November 26, 2009 11:03 AM

## 2010-06-14 NOTE — Assessment & Plan Note (Signed)
Summary: bp check-- orthostatics  Nurse Visit   Vital Signs:  Patient profile:   35 year old female Height:      68 inches Pulse (ortho):   90 / minute Pulse rhythm:   regular BP supine:   122 / 91  (right arm) BP sitting:   128 / 93  (right arm) BP standing:   120 / 91  (right arm) Cuff size:   regular  Serial Vital Signs/Assessments:  Time      Position  BP       Pulse  Resp  Temp     By           Lying RA  122/91   90                    Charolotte Capuchin, RN           Sitting   128/93   [address] RN           Standing  128/86   11                    Charolotte Capuchin, RN  Comments: pt came in today after having a sleep study last night.  She is here for orthostatic bps.  Pt has not had her verapamil today as she usually takes it in the am but doesn't have it with her.  bp from 120-133/over 86-97  HR from 87 to 111.  pt c/o swimmy-headedness that is worse the longer she stands.  She states it is starting to interfere with her daily activities and work.  Instructions were to increase Florinef if orthostatic however BP was elevated and Florinef was not increased.  Reviewed with Dr Graciela Husbands who suggested the possibility of changing pt to a beta blocker for BP and HR control however he would prefer Dr Johney Frame to make medication changes as necessary since he knows the pt.  Will forward information to Dr Johney Frame for his review and orders. By: Charolotte Capuchin, RN    Allergies: 1)  ! * Illison 2)  ! Toradol  Appended Document: bp check-- orthostatics Pt is already on coreg (beta blocker). no changes for now.

## 2010-06-29 ENCOUNTER — Encounter: Payer: Self-pay | Admitting: Internal Medicine

## 2010-06-29 ENCOUNTER — Ambulatory Visit (INDEPENDENT_AMBULATORY_CARE_PROVIDER_SITE_OTHER): Payer: Managed Care, Other (non HMO) | Admitting: Internal Medicine

## 2010-06-29 DIAGNOSIS — I951 Orthostatic hypotension: Secondary | ICD-10-CM

## 2010-06-29 DIAGNOSIS — R0789 Other chest pain: Secondary | ICD-10-CM

## 2010-06-29 DIAGNOSIS — I10 Essential (primary) hypertension: Secondary | ICD-10-CM

## 2010-07-06 NOTE — Assessment & Plan Note (Signed)
Summary: follow upPER CHECK OUT/SF/R.S FROM BUMPLIST/GD/F4M/KWB   Referring Provider:  Michele Rockers, MD Primary Provider:  Dr Clovis Riley   History of Present Illness: The patient presents today for routine electrophysiology followup. She reports doing very well since last being seen in our clinic.  Her unsteadiness and dizziness have resolved with fludricordisone.  Sinus tachycardia is also better.  Her chest pain resolved with recent breast augmentation. The patient denies symptoms of shortness of breath, orthopnea, PND, lower extremity edema, presyncope, syncope, or neurologic sequela.  Her daytime somnolence and fatigue and signicicantly improved with CPAP.   She continues to have trouble with weight loss. The patient is tolerating medications without difficulties and is otherwise without complaint today.   Current Medications (verified): 1)  Fludrocortisone Acetate 0.1 Mg Tabs (Fludrocortisone Acetate) .... Take One Daily 2)  Cymbalta 30 Mg Cpep (Duloxetine Hcl) .Marland Kitchen.. 1 Capsule  Once Daily 3)  Depo-Provera 150 Mg/ml Susp (Medroxyprogesterone Acetate) .... Uad 4)  Stool Softener 100 Mg Caps (Docusate Sodium) .... Uad 5)  Advil 200 Mg Tabs (Ibuprofen) .... Uad  Allergies (verified): 1)  ! * Illison 2)  ! Toradol  Past History:  Past Medical History: Reviewed history from 09/08/2009 and no changes required. Migraines costochondritis sinus tachycardia obesity orthostatic dizziness mild OSA  Past Surgical History: Reviewed history from 02/17/2010 and no changes required. bladder stem shortening at age 3 Bilateral breast reduction-2011  Social History: Reviewed history from 08/04/2009 and no changes required. Pt lives in [place] with spouse.  She has no children. Works at the [employer] for [employer]. Tobacco Use - No.  Alcohol Use - rare Regular Exercise - no Drug Use - no  Vital Signs:  Patient profile:   35 year old female Height:      68  inches Weight:      263 pounds BMI:     40.13 Pulse rate:   65 / minute Resp:     18 per minute BP sitting:   138 / 92  (right arm)  Vitals Entered By: Marrion Coy, CNA (June 29, 2010 9:50 AM)  Physical Exam  General:  overweight, in no acute distress. Head:  normocephalic and atraumatic Eyes:  PERRLA/EOM intact; conjunctiva and lids normal. Mouth:  Teeth, gums and palate normal. Oral mucosa normal. Neck:  supple Lungs:  Clear bilaterally to auscultation and percussion. Heart:  Non-displaced PMI, chest non-tender; regular rate and rhythm, S1, S2 without murmurs, rubs or gallops. Carotid upstroke normal, no bruit. Normal abdominal aortic size, no bruits. Femorals normal pulses, no bruits. Pedals normal pulses. No edema, no varicosities. Abdomen:  Bowel sounds positive; abdomen soft and non-tender without masses, organomegaly, or hernias noted. No hepatosplenomegaly. Msk:  Back normal, normal gait. Muscle strength and tone normal. Extremities:  No clubbing or cyanosis. Neurologic:  Alert and oriented x 3. Skin:  Intact without lesions or rashes.   EKG  Procedure date:  06/29/2010  Findings:      sinus rhythm 80 bpm, otherwise normal ekg  Impression & Recommendations:  Problem # 1:  CHEST PAIN, ATYPICAL (ICD-786.59)  resolved  The following medications were removed from the medication list:    Carvedilol 12.5 Mg Tabs (Carvedilol) .Marland Kitchen... Take one tablet by mouth every night  Problem # 2:  OBSTRUCTIVE SLEEP APNEA (ICD-327.23) continue CPAP  Problem # 3:  ORTHOSTATIC HYPOTENSION (ICD-458.0) decrease fludricortisone to MWF.  If no further orthostasis, stop fludricortisone in 1 month.  Problem # 4:  HYPERTENSION (ICD-401.9) stable no  changes today  Other Orders: EKG w/ Interpretation (93[address])  Patient Instructions: 1)  Your physician recommends that you schedule a follow-up appointment in: 6 months 2)  Your physician has recommended you make the following change in  your medication: Decrease fludrocortisone to one by mouth on Monday, Wednesday and Friday. If feeling OK after one month you can stop. Prescriptions: FLUDROCORTISONE ACETATE 0.1 MG TABS (FLUDROCORTISONE ACETATE) Take one by mouth on Monday, Wednesday and Friday  #15 x 6   Entered by:   Dossie Arbour, RN, BSN   Authorized by:   Hillis Range, MD   Signed by:   Dossie Arbour, RN, BSN on 06/29/2010   Method used:   Electronically to        [facility] (retail)       [address]       [place], [place]  [address]       Ph: [phone]       Fax: [phone]   RxID:   [id number]

## 2010-07-30 LAB — SURGICAL PCR SCREEN: MRSA, PCR: NEGATIVE

## 2010-07-30 LAB — CBC
HCT: 39.1 % (ref 36.0–46.0)
MCHC: 34.6 g/dL (ref 30.0–36.0)
MCV: 87.8 fL (ref 78.0–100.0)
Platelets: 250 10*3/uL (ref 150–400)
WBC: 7.6 10*3/uL (ref 4.0–10.5)

## 2010-07-30 LAB — BASIC METABOLIC PANEL
Chloride: 109 mEq/L (ref 96–112)
GFR calc Af Amer: >60 mL/min (ref >60)
GFR calc non Af Amer: >60 mL/min (ref >60)
Potassium: 4.2 mEq/L (ref 3.5–5.1)
Sodium: 139 mEq/L (ref 135–145)

## 2010-08-01 ENCOUNTER — Telehealth: Payer: Self-pay | Admitting: Internal Medicine

## 2010-08-11 NOTE — Progress Notes (Signed)
Summary: c/o dizzness- meds was changed  Phone Note Call from Patient Call back at Home Phone [phone]   Caller: Patient  Reason for Call: Talk to Nurse Summary of Call: c/o dizzy. meds was changed. - FLUDROCORTISONE ACETATE 0.1 MG TABS Take one by mouth on Monday Initial call taken by: Lorne Skeens,  August 01, 2010 10:47 AM  Follow-up for Phone Call        Patient has been experiencing dizziness since  Florinef was decreased to just MWF. She was taking it twice daily and since she decreased the dose, her dizziness  has gotten worse. I spoke with Dr. Jenel Lucks RN & we will have the patient take it once daily and call us back at the end of the week or beginning of next week with an update. Will forward to Dr. Johney Frame for review. Whitney Maeola Sarah RN  August 01, 2010 11:07 AM  Follow-up by: Whitney Maeola Sarah RN,  August 01, 2010 11:07 AM    Prescriptions: FLUDROCORTISONE ACETATE 0.1 MG TABS (FLUDROCORTISONE ACETATE) Take one by mouth on Monday, Wednesday and Friday  #30 x 2   Entered by:   Whitney Maeola Sarah RN   Authorized by:   Hillis Range, MD   Signed by:   Ellender Hose RN on 08/01/2010   Method used:   Electronically to        [facility] (retail)       [address]       [place], [place]  [address]       Ph: [phone]       Fax: [phone]   RxID:   [id number]

## 2010-11-17 ENCOUNTER — Encounter: Payer: Self-pay | Admitting: Internal Medicine

## 2010-12-28 ENCOUNTER — Ambulatory Visit (INDEPENDENT_AMBULATORY_CARE_PROVIDER_SITE_OTHER): Payer: Managed Care, Other (non HMO) | Admitting: Internal Medicine

## 2010-12-28 ENCOUNTER — Encounter: Payer: Self-pay | Admitting: Internal Medicine

## 2010-12-28 DIAGNOSIS — G4733 Obstructive sleep apnea (adult) (pediatric): Secondary | ICD-10-CM

## 2010-12-28 DIAGNOSIS — R079 Chest pain, unspecified: Secondary | ICD-10-CM

## 2010-12-28 DIAGNOSIS — I951 Orthostatic hypotension: Secondary | ICD-10-CM

## 2010-12-28 DIAGNOSIS — R0789 Other chest pain: Secondary | ICD-10-CM

## 2010-12-28 DIAGNOSIS — I10 Essential (primary) hypertension: Secondary | ICD-10-CM

## 2010-12-28 DIAGNOSIS — I498 Other specified cardiac arrhythmias: Secondary | ICD-10-CM

## 2010-12-28 MED ORDER — FLUDROCORTISONE ACETATE 0.1 MG PO TABS: 0.1000 mg | ORAL_TABLET | Freq: Every day | ORAL | Status: DC

## 2010-12-28 NOTE — Assessment & Plan Note (Signed)
Stable with daily florinef

## 2010-12-28 NOTE — Progress Notes (Signed)
The patient presents today for routine electrophysiology followup.  Since last being seen in our clinic, the patient reports doing very well.  She reports having an episode of chest pain several days ago with radiation into her L shoulder while at rest.  The reports similar chest pain with manipulation of her chest wall.  Today, she denies symptoms of palpitations, shortness of breath, orthopnea, PND, lower extremity edema, dizziness, presyncope, syncope, or neurologic sequela.  She continues to struggle with weight reduction. The patient feels that she is tolerating medications without difficulties and is otherwise without complaint today.   Past Medical History  Diagnosis Date  . Migraines   . Costochondritis     improved with breast reduction surgery  . Sinus tachycardia   . Obesity   . Dizziness     orthostatic  . OSA (obstructive sleep apnea)     mild, improved with CPAP   Past Surgical History  Procedure Date  . Bladder stem shortening     at age 72  . Bilateral breast reduction     Current Outpatient Prescriptions  Medication Sig Dispense Refill  . docusate sodium (STOOL SOFTENER) 100 MG capsule Use as directed       . DULoxetine (CYMBALTA) 30 MG capsule Take 30 mg by mouth daily.        . fludrocortisone (FLORINEF) 0.1 MG tablet Take 1 tablet (0.1 mg total) by mouth daily.  30 tablet  11  . ibuprofen (ADVIL,MOTRIN) 200 MG tablet Use as directed       . medroxyPROGESTERone (DEPO-PROVERA) 150 MG/ML injection Use as directed         Allergies  Allergen Reactions  . Ketorolac Tromethamine     History   Social History  . Marital Status: Married    Spouse Name: N/A    Number of Children: 0  . Years of Education: N/A   Occupational History  . works at [employer] for [employer].    Social History Main Topics  . Smoking status: Never Smoker   . Smokeless tobacco: Not on file  . Alcohol Use: No     rare  . Drug Use: Not on file  . Sexually Active: Not on file    Other Topics Concern  . Not on file   Social History Narrative   Pt lives in [place] with spouse.  She has no children.Works at the [employer] for [employer].    Family History  Problem Relation Age of Onset  . Coronary artery disease Father   . Hypertension Father   . Heart attack Paternal Grandfather     ROS-  All systems are reviewed and are negative except as outlined in the HPI above    Physical Exam: Filed Vitals:   12/28/10 1045  BP: 134/88  Pulse: 93  Height: 5\' 7"  (1.702 m)  Weight: 261 lb (118.389 kg)    GEN- The patient is obese appearing, alert and oriented x 3 today.   Head- normocephalic, atraumatic Eyes-  Sclera clear, conjunctiva pink Ears- hearing intact Oropharynx- clear Neck- supple, no JVP Lymph- no cervical lymphadenopathy Lungs- Clear to ausculation bilaterally, normal work of breathing Heart- Regular rate and rhythm, no murmurs, rubs or gallops, PMI not laterally displaced GI- soft, NT, ND, + BS Extremities- no clubbing, cyanosis, or edema MS- no significant deformity or atrophy, moderately tender over her left upper chest with palpitation Skin- no rash or lesion Psych- euthymic mood, full affect Neuro- strength and sensation are  intact  ekg today reveals sinus rhythm 93 bpm, otherwise normal ekg  Assessment and Plan:

## 2010-12-28 NOTE — Patient Instructions (Signed)
Your physician wants you to follow-up in: 6 months with Dr Jacquiline Doe will receive a reminder letter in the mail two months in advance. If you don't receive a letter, please call our office to schedule the follow-up appointment.  Your physician has requested that you have an exercise tolerance test. For further information please visit [email or web address] Please also follow instruction sheet, as given.--with Tereso Newcomer

## 2010-12-28 NOTE — Assessment & Plan Note (Signed)
Much improved with florinef

## 2010-12-28 NOTE — Assessment & Plan Note (Signed)
Compliant with CPAP 

## 2010-12-28 NOTE — Assessment & Plan Note (Signed)
Much improved

## 2010-12-28 NOTE — Assessment & Plan Note (Signed)
Likely musculoskeletal in nature.  The patient remains however very concerned about her chest pain. I will perform GXT for further cardiac risk stratefication.  If normal, no further CV testing would be advised  Ongoing weight loss is encouraged

## 2011-01-19 ENCOUNTER — Ambulatory Visit (INDEPENDENT_AMBULATORY_CARE_PROVIDER_SITE_OTHER): Payer: Managed Care, Other (non HMO) | Admitting: Physician Assistant

## 2011-01-19 DIAGNOSIS — R079 Chest pain, unspecified: Secondary | ICD-10-CM

## 2011-01-19 NOTE — Progress Notes (Signed)
Exercise Treadmill Test  Pre-Exercise Testing Evaluation Rhythm: normal sinus  Rate: 78   PR:  .13 QRS:  .07  QT:  .37 QTc: .42     Test  Exercise Tolerance Test Ordering MD: Hillis Range, MD  Interpreting MD:  Tereso Newcomer, PA-C  Unique Test No: 1  Treadmill:  1  Indication for ETT: chest pain - rule out ischemia  Contraindication to ETT: No   Stress Modality: exercise - treadmill  Cardiac Imaging Performed: non   Protocol: standard Bruce - maximal  Max BP:  150/84  Max MPHR (bpm): 183 85% MPR (bpm): 155  MPHR obtained (bpm):  166 % MPHR obtained:  95  Reached 85% MPHR (min:sec):  3:45 Total Exercise Time (min-sec):  4:25  Workload in METS:  6.3 Borg Scale: 18  Reason ETT Terminated:  patient's desire to stop    ST Segment Analysis At Rest: normal ST segments - no evidence of significant ST depression With Exercise: no evidence of significant ST depression  Other Information Arrhythmia:  No Angina during ETT:  absent (0) Quality of ETT:  diagnostic  ETT Interpretation:  normal - no evidence of ischemia by ST analysis  Comments: Poor exercise tolerance. No chest pain. Normal BP response to exercise. No ST-T changes to suggest ischemia.  Patient complained of lightheadedness at maximal effort.  Recommendations: Follow up with Dr. Johney Frame as directed.

## 2011-01-30 IMAGING — CR DG RIBS W/ CHEST 3+V*R*
3 series · 3 of 3 positions shown · non-contrast
Comparison: None

CLINICAL DATA: Right anterior chest pain for 1 month.

RIGHT RIBS AND CHEST - 3+ VIEW

[view not recorded (1 of 3)]
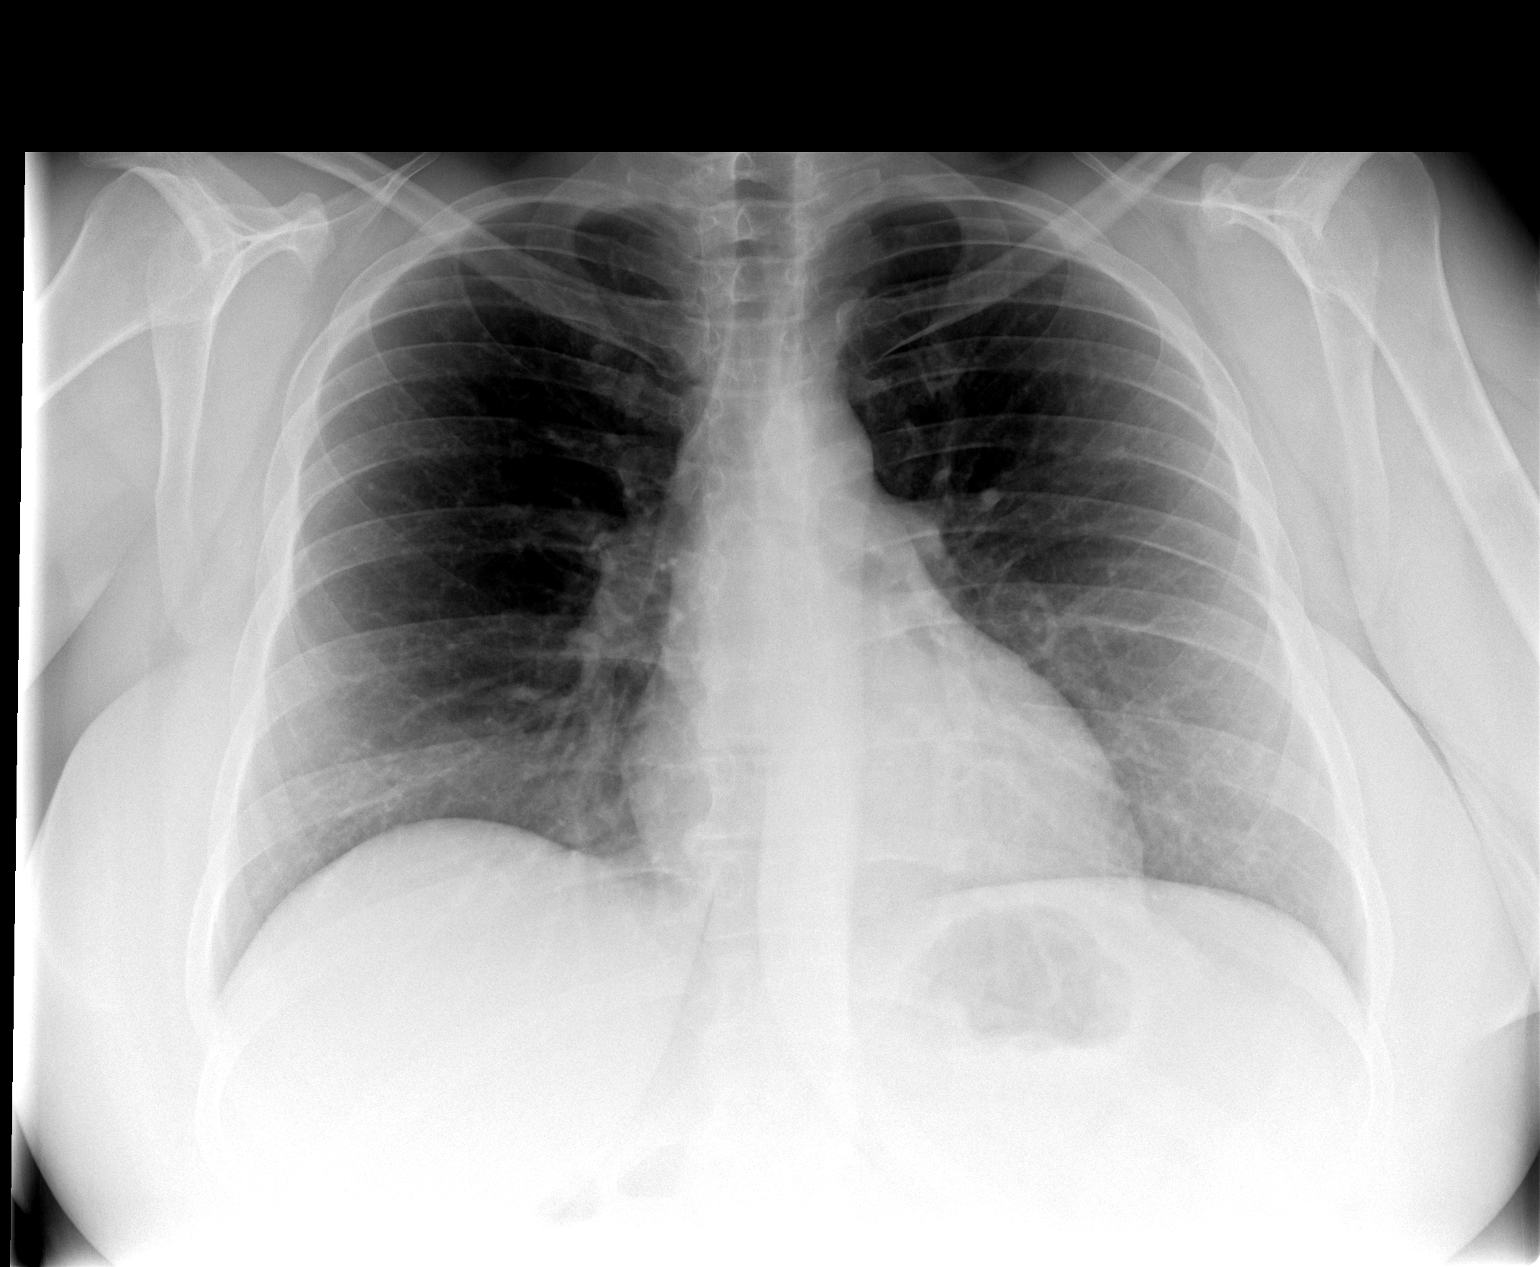

[view not recorded (2 of 3)]
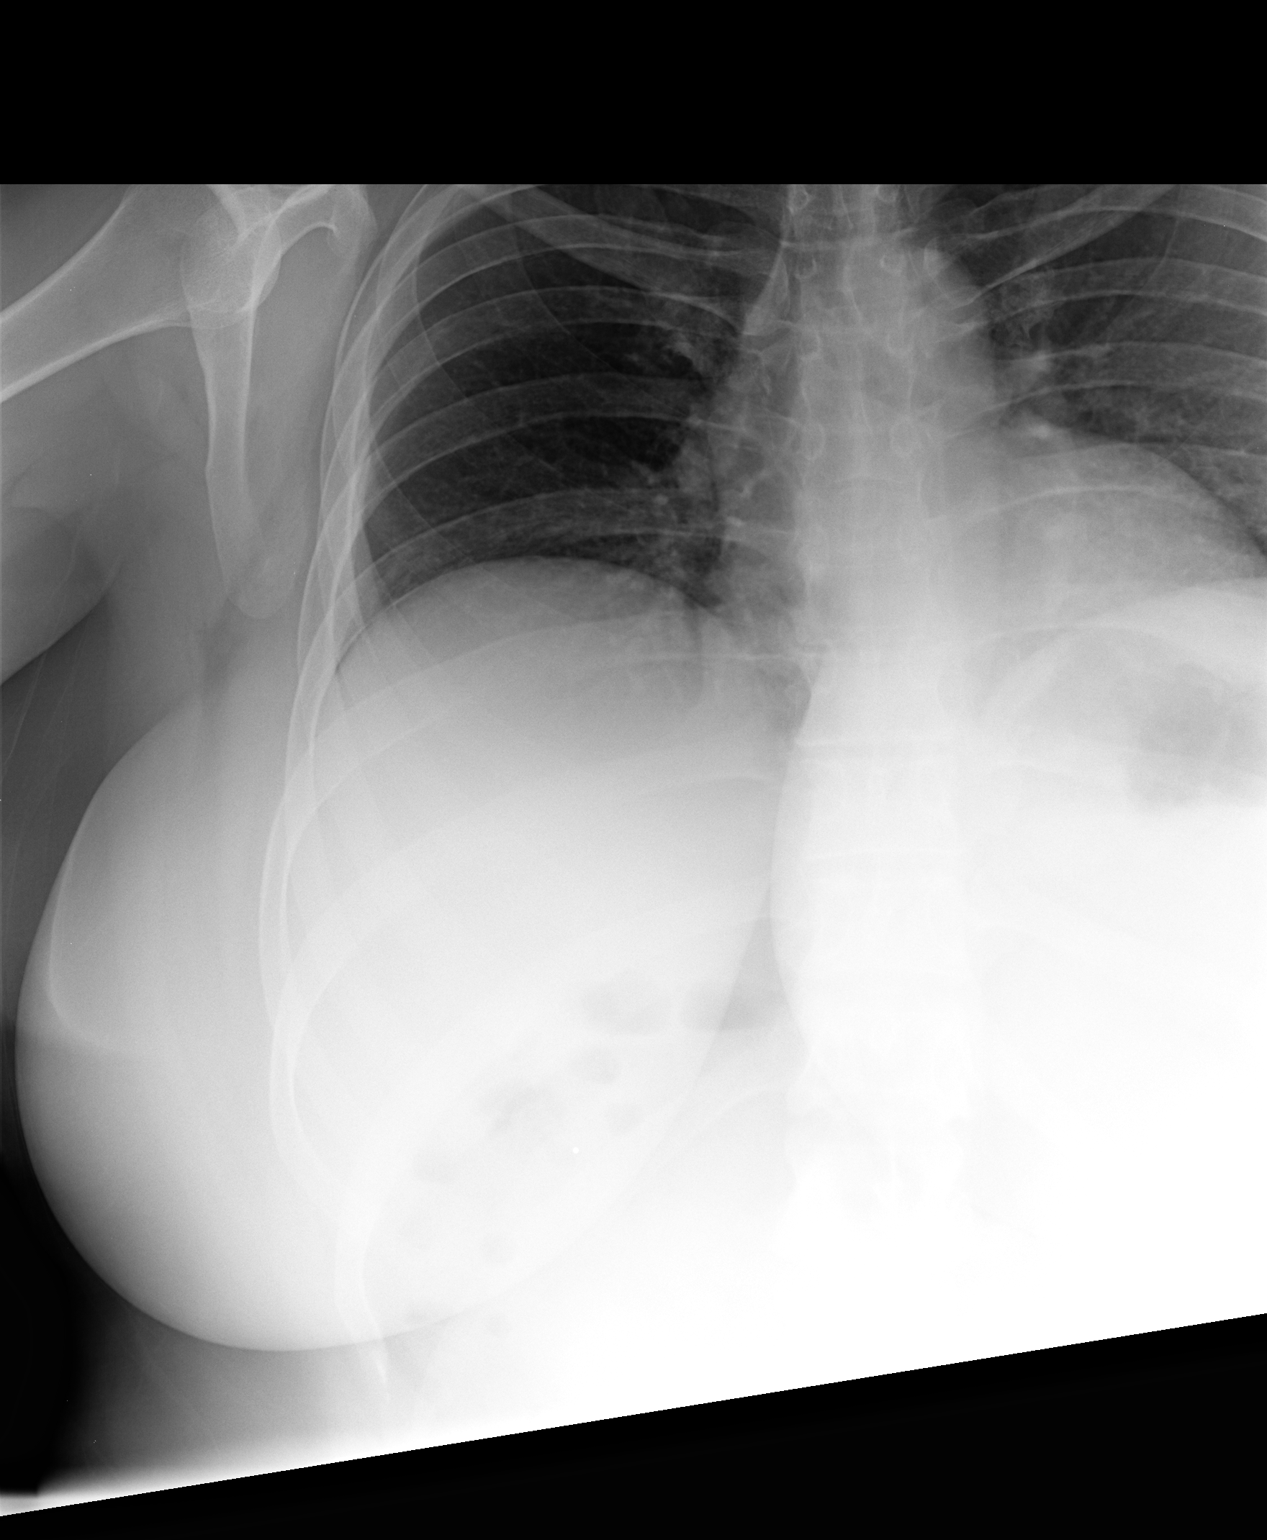

[view not recorded (3 of 3)]
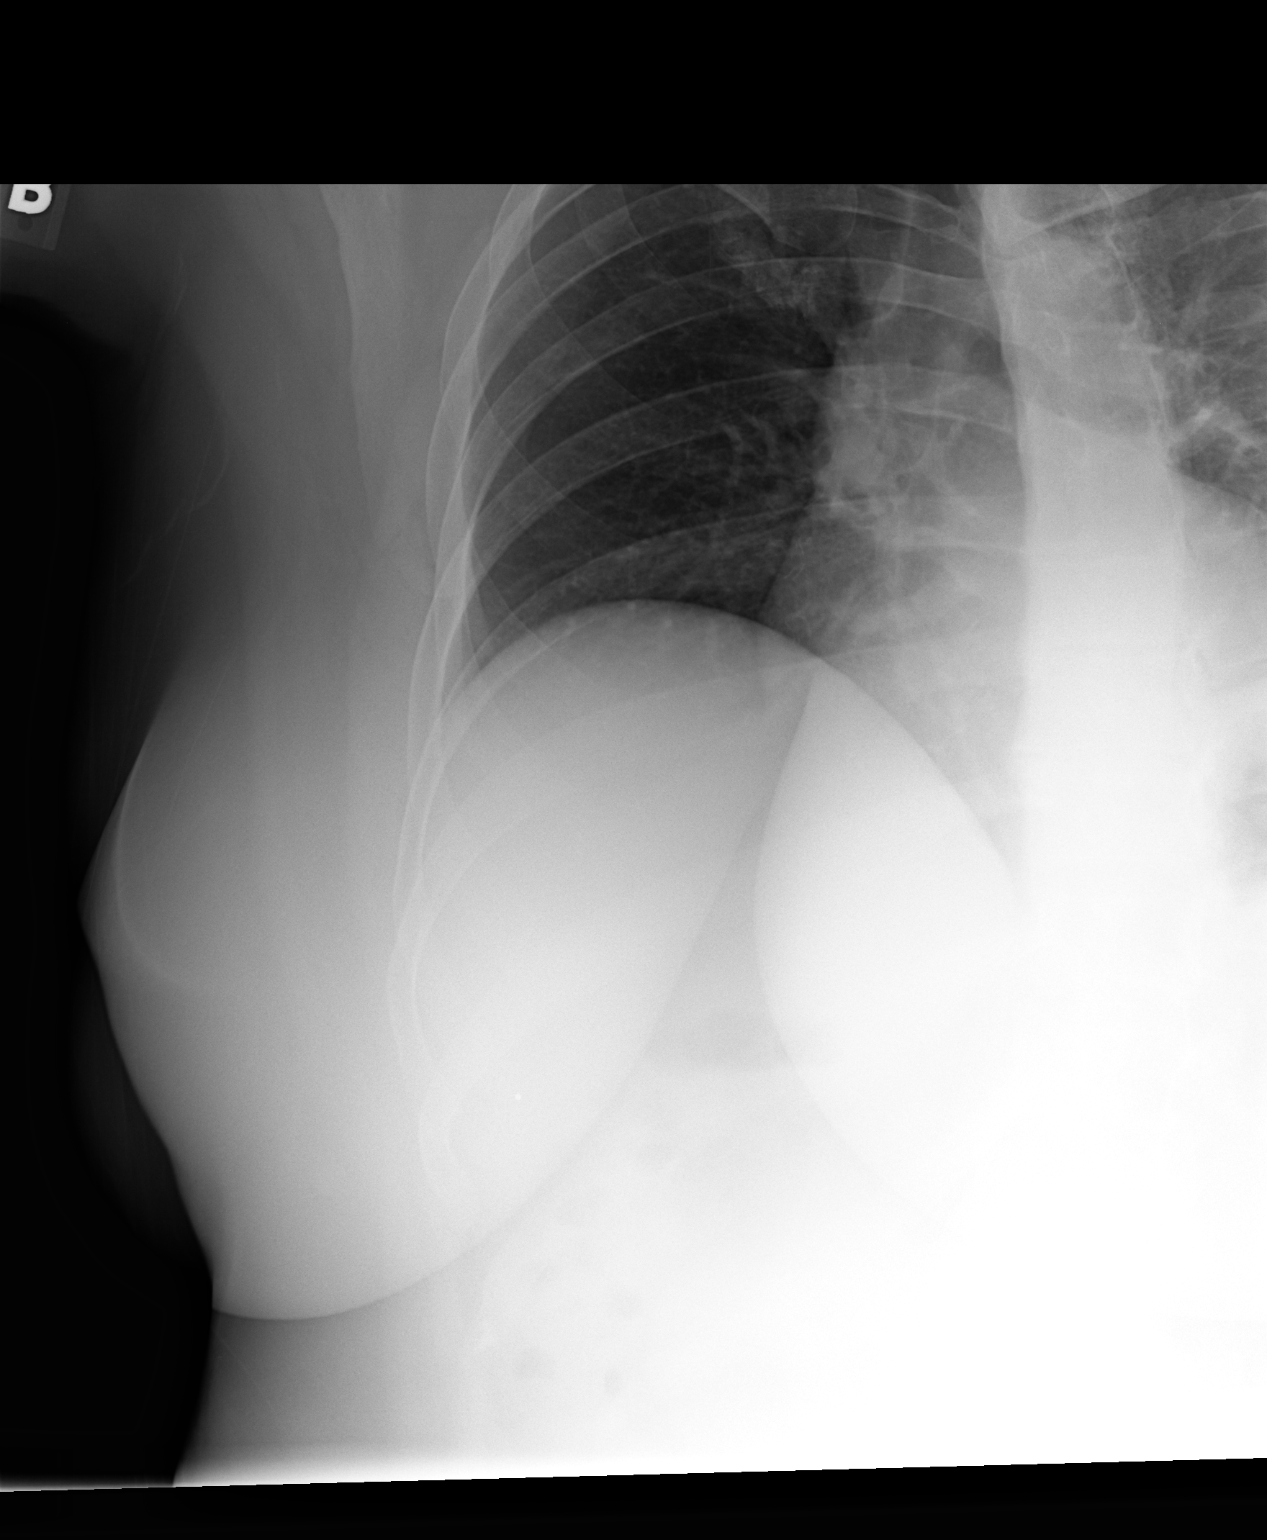

[3 of 3 positions shown; findings below may reference images not displayed]

FINDINGS: The heart size and vascularity are normal and the lungs
are clear.  There are no effusions.  The right ribs appear normal.
IMPRESSION: Normal exam.

## 2011-04-26 ENCOUNTER — Telehealth: Payer: Self-pay | Admitting: Internal Medicine

## 2011-04-26 NOTE — Telephone Encounter (Signed)
All Cardiac faxed to [facility] @ [phone] 04/26/11/km

## 2011-05-18 ENCOUNTER — Telehealth: Payer: Self-pay | Admitting: Internal Medicine

## 2011-05-18 NOTE — Telephone Encounter (Signed)
New Msg: Pt calling wanting to know if she can get an RX called in for cold. Pt has POT's and is drinking a lot of fluids with not much relief. Pt said symptoms have been present for 3-4-days. Please return pt call to discuss further.

## 2011-05-18 NOTE — Telephone Encounter (Signed)
Spoke with patient.  She is going to increase her Florinef to twice daily until she gets over this cold.  I have explained to her that with POTs this is expected and should return to her normal state but during this time she will need to push fluids and keep her medication at twice daily and call me back if the "swimmy Headedness" does not get better

## 2011-10-05 ENCOUNTER — Other Ambulatory Visit: Payer: Self-pay | Admitting: Obstetrics and Gynecology

## 2012-01-29 ENCOUNTER — Telehealth: Payer: Self-pay | Admitting: Internal Medicine

## 2012-01-29 NOTE — Telephone Encounter (Signed)
Will see on Friday

## 2012-01-29 NOTE — Telephone Encounter (Signed)
Pt will be at home after 4:30 and if you call the home number please make sure to dial [phone] because it will not go through unless you dial that first. Pt has POTTS and she is having new symptoms she wants to go over with you like SOB, cold all the time, constant headache and she wants to go over this with you and make sure everything is ok

## 2012-01-31 ENCOUNTER — Encounter: Payer: Self-pay | Admitting: Internal Medicine

## 2012-01-31 ENCOUNTER — Ambulatory Visit (INDEPENDENT_AMBULATORY_CARE_PROVIDER_SITE_OTHER): Payer: Self-pay | Admitting: Internal Medicine

## 2012-01-31 DIAGNOSIS — R0789 Other chest pain: Secondary | ICD-10-CM

## 2012-01-31 DIAGNOSIS — I498 Other specified cardiac arrhythmias: Secondary | ICD-10-CM

## 2012-01-31 DIAGNOSIS — G4733 Obstructive sleep apnea (adult) (pediatric): Secondary | ICD-10-CM

## 2012-01-31 DIAGNOSIS — I951 Orthostatic hypotension: Secondary | ICD-10-CM

## 2012-01-31 DIAGNOSIS — I10 Essential (primary) hypertension: Secondary | ICD-10-CM

## 2012-01-31 DIAGNOSIS — R000 Tachycardia, unspecified: Secondary | ICD-10-CM

## 2012-01-31 NOTE — Progress Notes (Signed)
The patient presents today for routine electrophysiology followup.  Since last being seen in our clinic, the patient reports doing reasonably well.  Her atypical chest wall pain is stable.  She has stable dizziness.  Unfortunately, she continues to gain weight.  She has SOB with moderate activity.  Today, she denies symptoms of palpitations, orthopnea, PND, lower extremity edema, dizziness, presyncope, syncope, or neurologic sequela.  The patient feels that she is tolerating medications without difficulties and is otherwise without complaint today.   Past Medical History  Diagnosis Date  . Migraines   . Costochondritis     improved with breast reduction surgery  . Sinus tachycardia   . Obesity   . Dizziness     orthostatic  . OSA (obstructive sleep apnea)     mild, improved with CPAP   Past Surgical History  Procedure Date  . Bladder stem shortening     at age 9  . Bilateral breast reduction     Current Outpatient Prescriptions  Medication Sig Dispense Refill  . docusate sodium (STOOL SOFTENER) 100 MG capsule Use as directed       . fludrocortisone (FLORINEF) 0.1 MG tablet Take 1 tablet (0.1 mg total) by mouth daily.  30 tablet  11  . ibuprofen (ADVIL,MOTRIN) 200 MG tablet Use as directed       . medroxyPROGESTERone (DEPO-PROVERA) 150 MG/ML injection Use as directed         Allergies  Allergen Reactions  . Ketorolac Tromethamine     History   Social History  . Marital Status: Married    Spouse Name: N/A    Number of Children: 0  . Years of Education: N/A   Occupational History  . works at [employer] for [employer].    Social History Main Topics  . Smoking status: Never Smoker   . Smokeless tobacco: Not on file  . Alcohol Use: No     rare  . Drug Use: Not on file  . Sexually Active: Not on file   Other Topics Concern  . Not on file   Social History Narrative   Pt lives in [place] with spouse.  She has no children.Works at the [employer] for  [employer].    Family History  Problem Relation Age of Onset  . Coronary artery disease Father   . Hypertension Father   . Heart attack Paternal Grandfather     ROS-  All systems are reviewed and are negative except as outlined in the HPI above    Physical Exam: Filed Vitals:   01/31/12 0912  BP: 132/98  Pulse: 80  Height: 5\' 7"  (1.702 m)  Weight: 273 lb (123.832 kg)  SpO2: 99%    GEN- The patient is obese appearing, alert and oriented x 3 today.   Head- normocephalic, atraumatic Eyes-  Sclera clear, conjunctiva pink Ears- hearing intact Oropharynx- clear Neck- supple, no JVP Lymph- no cervical lymphadenopathy Lungs- Clear to ausculation bilaterally, normal work of breathing Heart- Regular rate and rhythm, no murmurs, rubs or gallops, PMI not laterally displaced GI- soft, NT, ND, + BS Extremities- no clubbing, cyanosis, or edema MS- no significant deformity or atrophy, moderately tender over her left upper chest with palpitation Skin- no rash or lesion Psych- euthymic mood, full affect Neuro- strength and sensation are intact  ekg today reveals sinus rhythm 85 bpm, otherwise normal ekg  Assessment and Plan:   CHEST PAIN, ATYPICAL  Unchanged, no further workup at this point  HYPERTENSION Stable  ORTHOSTATIC HYPOTENSION  Stable with twice daily florinef  OBSTRUCTIVE SLEEP APNEA  Compliant with CPAP  SINUS TACHYCARDIA   Much improved with florinef  Morbid obesity We had a long discussion today about the importance of weight reduction.  Unfortunately, she continues to gain weight.  Given the significant impact of her obesity on her overall health, I will refer to Dr Daphine Deutscher at [facility] for the bariatric surgical program.  Regular exercise is also encouraged.

## 2012-01-31 NOTE — Patient Instructions (Signed)
Your physician wants you to follow-up in: 12 months with Dr Jacquiline Doe will receive a reminder letter in the mail two months in advance. If you don't receive a letter, please call our office to schedule the follow-up appointment.   You have been referred to Dr Oneida Arenas bariatric surgery

## 2012-07-30 ENCOUNTER — Other Ambulatory Visit: Payer: Self-pay | Admitting: Otolaryngology

## 2012-07-30 DIAGNOSIS — R6884 Jaw pain: Secondary | ICD-10-CM

## 2012-08-01 ENCOUNTER — Ambulatory Visit
Admission: RE | Admit: 2012-08-01 | Discharge: 2012-08-01 | Disposition: A | Discharge status: Home / Self Care | Payer: Managed Care, Other (non HMO) | Source: Ambulatory Visit | Attending: Otolaryngology | Admitting: Otolaryngology

## 2012-08-01 MED ORDER — IOHEXOL 300 MG/ML  SOLN
75.0000 mL | Freq: Once | INTRAMUSCULAR | Status: AC | PRN
  Administered 2012-08-01: 75 mL via INTRAVENOUS

## 2012-08-21 ENCOUNTER — Other Ambulatory Visit: Payer: Self-pay | Admitting: Neurology

## 2012-08-25 ENCOUNTER — Ambulatory Visit
Admission: RE | Admit: 2012-08-25 | Discharge: 2012-08-25 | Disposition: A | Discharge status: Home / Self Care | Payer: Managed Care, Other (non HMO) | Source: Ambulatory Visit | Attending: Neurology | Admitting: Neurology

## 2012-08-25 MED ORDER — GADOBENATE DIMEGLUMINE 529 MG/ML IV SOLN
20.0000 mL | Freq: Once | INTRAVENOUS | Status: AC | PRN
  Administered 2012-08-25: 20 mL via INTRAVENOUS

## 2012-11-03 ENCOUNTER — Encounter (HOSPITAL_COMMUNITY): Payer: Self-pay | Admitting: Pharmacy Technician

## 2012-11-08 ENCOUNTER — Encounter (HOSPITAL_COMMUNITY): Payer: Self-pay

## 2012-11-08 ENCOUNTER — Encounter (HOSPITAL_COMMUNITY)
Admission: RE | Admit: 2012-11-08 | Discharge: 2012-11-08 | Disposition: A | Discharge status: Home / Self Care | Payer: Managed Care, Other (non HMO) | Source: Ambulatory Visit | Attending: Obstetrics and Gynecology | Admitting: Obstetrics and Gynecology

## 2012-11-08 DIAGNOSIS — Z01818 Encounter for other preprocedural examination: Secondary | ICD-10-CM | POA: Insufficient documentation

## 2012-11-08 DIAGNOSIS — Z01812 Encounter for preprocedural laboratory examination: Secondary | ICD-10-CM | POA: Insufficient documentation

## 2012-11-08 HISTORY — DX: Myoneural disorder, unspecified: G70.9

## 2012-11-08 HISTORY — DX: Postural orthostatic tachycardia syndrome (POTS): G90.A

## 2012-11-08 HISTORY — DX: Shortness of breath: R06.02

## 2012-11-08 HISTORY — DX: Other specified cardiac arrhythmias: I49.8

## 2012-11-08 HISTORY — DX: Tachycardia, unspecified: R00.0

## 2012-11-08 HISTORY — DX: Orthostatic hypotension: I95.1

## 2012-11-08 HISTORY — DX: Cardiac arrhythmia, unspecified: I49.9

## 2012-11-08 LAB — BASIC METABOLIC PANEL
CO2: 26 mEq/L (ref 19–32)
Calcium: 9.7 mg/dL (ref 8.4–10.5)
GFR calc non Af Amer: >90 mL/min (ref >90)
Potassium: 4.2 mEq/L (ref 3.5–5.1)
Sodium: 138 mEq/L (ref 135–145)

## 2012-11-08 LAB — CBC
Hemoglobin: 14.6 g/dL (ref 12.0–15.0)
MCH: 29.2 pg (ref 26.0–34.0)
MCHC: 34 g/dL (ref 30.0–36.0)
Platelets: 299 10*3/uL (ref 150–400)
RBC: 5 MIL/uL (ref 3.87–5.11)

## 2012-11-08 NOTE — Patient Instructions (Signed)
Your procedure is scheduled on:11/13/12  Enter through the Main Entrance at :6am  Pick up desk phone and dial [phone] and inform us of your arrival.  Please call [phone] if you have any problems the morning of surgery.  Remember: Do not eat food or drink liquids, including water, after midnight:Tuesday   You may brush your teeth the morning of surgery.    DO NOT wear jewelry, eye make-up, lipstick,body lotion, or dark fingernail polish.  (Polished toes are ok) You may wear deodorant.  If you are to be admitted after surgery, leave suitcase in car until your room has been assigned. Patients discharged on the day of surgery will not be allowed to drive home. Wear loose fitting, comfortable clothes for your ride home.

## 2012-11-13 ENCOUNTER — Encounter (HOSPITAL_COMMUNITY): Payer: Self-pay | Admitting: Anesthesiology

## 2012-11-13 ENCOUNTER — Inpatient Hospital Stay (HOSPITAL_COMMUNITY)
Admission: RE | Admit: 2012-11-13 | Discharge: 2012-11-15 | DRG: 359 | Disposition: A | Discharge status: Home / Self Care | Payer: BC Managed Care – PPO | Source: Ambulatory Visit | Attending: Obstetrics and Gynecology | Admitting: Obstetrics and Gynecology

## 2012-11-13 ENCOUNTER — Encounter (HOSPITAL_COMMUNITY)
Admission: RE | Disposition: A | Discharge status: Home / Self Care | Payer: Self-pay | Source: Ambulatory Visit | Attending: Obstetrics and Gynecology

## 2012-11-13 ENCOUNTER — Inpatient Hospital Stay (HOSPITAL_COMMUNITY): Payer: BC Managed Care – PPO | Admitting: Anesthesiology

## 2012-11-13 DIAGNOSIS — N8300 Follicular cyst of ovary, unspecified side: Secondary | ICD-10-CM | POA: Diagnosis present

## 2012-11-13 DIAGNOSIS — N854 Malposition of uterus: Secondary | ICD-10-CM | POA: Diagnosis present

## 2012-11-13 DIAGNOSIS — R0789 Other chest pain: Secondary | ICD-10-CM

## 2012-11-13 DIAGNOSIS — N834 Prolapse and hernia of ovary and fallopian tube, unspecified side: Secondary | ICD-10-CM | POA: Diagnosis present

## 2012-11-13 DIAGNOSIS — K589 Irritable bowel syndrome without diarrhea: Secondary | ICD-10-CM | POA: Diagnosis present

## 2012-11-13 DIAGNOSIS — R6882 Decreased libido: Secondary | ICD-10-CM | POA: Diagnosis present

## 2012-11-13 DIAGNOSIS — N949 Unspecified condition associated with female genital organs and menstrual cycle: Principal | ICD-10-CM | POA: Diagnosis present

## 2012-11-13 DIAGNOSIS — G4733 Obstructive sleep apnea (adult) (pediatric): Secondary | ICD-10-CM

## 2012-11-13 DIAGNOSIS — I10 Essential (primary) hypertension: Secondary | ICD-10-CM

## 2012-11-13 DIAGNOSIS — E282 Polycystic ovarian syndrome: Secondary | ICD-10-CM | POA: Diagnosis present

## 2012-11-13 DIAGNOSIS — Z90710 Acquired absence of both cervix and uterus: Secondary | ICD-10-CM

## 2012-11-13 DIAGNOSIS — I951 Orthostatic hypotension: Secondary | ICD-10-CM

## 2012-11-13 DIAGNOSIS — I498 Other specified cardiac arrhythmias: Secondary | ICD-10-CM | POA: Diagnosis present

## 2012-11-13 HISTORY — PX: SALPINGOOPHORECTOMY: SHX82

## 2012-11-13 HISTORY — PX: ABDOMINAL HYSTERECTOMY: SHX81

## 2012-11-13 SURGERY — HYSTERECTOMY, ABDOMINAL
Anesthesia: General | Site: Abdomen | Wound class: Clean Contaminated

## 2012-11-13 MED ORDER — ONDANSETRON HCL 4 MG/2ML IJ SOLN
4.0000 mg | Freq: Four times a day (QID) | INTRAMUSCULAR | Status: DC | PRN
  Administered 2012-11-13: 4 mg via INTRAVENOUS
  Filled 2012-11-13: qty 2

## 2012-11-13 MED ORDER — MEPERIDINE HCL 25 MG/ML IJ SOLN: 6.2500 mg | INTRAMUSCULAR | Status: DC | PRN

## 2012-11-13 MED ORDER — ONDANSETRON HCL 4 MG/2ML IJ SOLN
INTRAMUSCULAR | Status: DC | PRN
  Administered 2012-11-13: 4 mg via INTRAVENOUS

## 2012-11-13 MED ORDER — ROCURONIUM BROMIDE 50 MG/5ML IV SOLN
INTRAVENOUS | Status: AC
  Filled 2012-11-13: qty 1

## 2012-11-13 MED ORDER — PROMETHAZINE HCL 25 MG/ML IJ SOLN
12.5000 mg | Freq: Four times a day (QID) | INTRAMUSCULAR | Status: DC | PRN
  Administered 2012-11-13: 12.5 mg via INTRAVENOUS
  Filled 2012-11-13: qty 1

## 2012-11-13 MED ORDER — KETOROLAC TROMETHAMINE 30 MG/ML IJ SOLN
INTRAMUSCULAR | Status: AC
  Filled 2012-11-13: qty 1

## 2012-11-13 MED ORDER — DIPHENHYDRAMINE HCL 12.5 MG/5ML PO ELIX: 12.5000 mg | ORAL_SOLUTION | Freq: Four times a day (QID) | ORAL | Status: DC | PRN

## 2012-11-13 MED ORDER — HYDROMORPHONE 0.3 MG/ML IV SOLN
INTRAVENOUS | Status: DC
  Administered 2012-11-13: 0.2 mg via INTRAVENOUS
  Administered 2012-11-13: 4 mL via INTRAVENOUS
  Administered 2012-11-13: 10:00:00 via INTRAVENOUS
  Administered 2012-11-13: 3.3 mL via INTRAVENOUS
  Administered 2012-11-14: 0.6 mg via INTRAVENOUS
  Administered 2012-11-14: 0.599 mg via INTRAVENOUS
  Filled 2012-11-13: qty 25

## 2012-11-13 MED ORDER — ACETAMINOPHEN 160 MG/5ML PO SOLN
ORAL | Status: AC
  Administered 2012-11-13: 975 mg via ORAL
  Filled 2012-11-13: qty 40.6

## 2012-11-13 MED ORDER — MIDAZOLAM HCL 5 MG/5ML IJ SOLN
INTRAMUSCULAR | Status: DC | PRN
  Administered 2012-11-13: 2 mg via INTRAVENOUS

## 2012-11-13 MED ORDER — DEXAMETHASONE SODIUM PHOSPHATE 10 MG/ML IJ SOLN
INTRAMUSCULAR | Status: AC
  Filled 2012-11-13: qty 1

## 2012-11-13 MED ORDER — NEOSTIGMINE METHYLSULFATE 1 MG/ML IJ SOLN
INTRAMUSCULAR | Status: DC | PRN
  Administered 2012-11-13: 2.5 mg via INTRAVENOUS

## 2012-11-13 MED ORDER — GLYCOPYRROLATE 0.2 MG/ML IJ SOLN
INTRAMUSCULAR | Status: AC
  Filled 2012-11-13: qty 2

## 2012-11-13 MED ORDER — LACTATED RINGERS IV SOLN: INTRAVENOUS | Status: DC

## 2012-11-13 MED ORDER — NEOSTIGMINE METHYLSULFATE 1 MG/ML IJ SOLN
INTRAMUSCULAR | Status: AC
  Filled 2012-11-13: qty 1

## 2012-11-13 MED ORDER — CEFAZOLIN SODIUM-DEXTROSE 2-3 GM-% IV SOLR
INTRAVENOUS | Status: AC
  Filled 2012-11-13: qty 50

## 2012-11-13 MED ORDER — HYDROMORPHONE HCL PF 1 MG/ML IJ SOLN
INTRAMUSCULAR | Status: AC
  Filled 2012-11-13: qty 1

## 2012-11-13 MED ORDER — ACETAMINOPHEN 10 MG/ML IV SOLN: 1000.0000 mg | Freq: Once | INTRAVENOUS | Status: DC | PRN

## 2012-11-13 MED ORDER — ACETAMINOPHEN 160 MG/5ML PO SOLN: 975.0000 mg | Freq: Once | ORAL | Status: AC

## 2012-11-13 MED ORDER — LIDOCAINE HCL (CARDIAC) 20 MG/ML IV SOLN
INTRAVENOUS | Status: DC | PRN
  Administered 2012-11-13: 60 mg via INTRAVENOUS

## 2012-11-13 MED ORDER — BUPIVACAINE HCL (PF) 0.25 % IJ SOLN
INTRAMUSCULAR | Status: AC
  Filled 2012-11-13: qty 30

## 2012-11-13 MED ORDER — SODIUM CHLORIDE 0.9 % IJ SOLN: 9.0000 mL | INTRAMUSCULAR | Status: DC | PRN

## 2012-11-13 MED ORDER — MENTHOL 3 MG MT LOZG: 1.0000 | LOZENGE | OROMUCOSAL | Status: DC | PRN

## 2012-11-13 MED ORDER — PROPOFOL 10 MG/ML IV BOLUS
INTRAVENOUS | Status: DC | PRN
  Administered 2012-11-13: 150 mg via INTRAVENOUS
  Administered 2012-11-13: 50 mg via INTRAVENOUS

## 2012-11-13 MED ORDER — FENTANYL CITRATE 0.05 MG/ML IJ SOLN
INTRAMUSCULAR | Status: AC
  Filled 2012-11-13: qty 5

## 2012-11-13 MED ORDER — GABAPENTIN 300 MG PO CAPS
900.0000 mg | ORAL_CAPSULE | Freq: Two times a day (BID) | ORAL | Status: DC
  Administered 2012-11-13 – 2012-11-15 (×4): 900 mg via ORAL
  Filled 2012-11-13 (×5): qty 3

## 2012-11-13 MED ORDER — NALOXONE HCL 0.4 MG/ML IJ SOLN: 0.4000 mg | INTRAMUSCULAR | Status: DC | PRN

## 2012-11-13 MED ORDER — DIPHENHYDRAMINE HCL 50 MG/ML IJ SOLN: 12.5000 mg | Freq: Four times a day (QID) | INTRAMUSCULAR | Status: DC | PRN

## 2012-11-13 MED ORDER — LIDOCAINE HCL (CARDIAC) 20 MG/ML IV SOLN
INTRAVENOUS | Status: AC
  Filled 2012-11-13: qty 5

## 2012-11-13 MED ORDER — FENTANYL CITRATE 0.05 MG/ML IJ SOLN
INTRAMUSCULAR | Status: DC | PRN
  Administered 2012-11-13: 150 ug via INTRAVENOUS
  Administered 2012-11-13: 100 ug via INTRAVENOUS

## 2012-11-13 MED ORDER — GLYCOPYRROLATE 0.2 MG/ML IJ SOLN
INTRAMUSCULAR | Status: DC | PRN
  Administered 2012-11-13: .5 mg via INTRAVENOUS

## 2012-11-13 MED ORDER — HYDROMORPHONE HCL PF 1 MG/ML IJ SOLN
INTRAMUSCULAR | Status: AC
  Administered 2012-11-13: 0.5 mg via INTRAVENOUS
  Filled 2012-11-13: qty 1

## 2012-11-13 MED ORDER — FLUDROCORTISONE ACETATE 0.1 MG PO TABS
0.1000 mg | ORAL_TABLET | Freq: Every day | ORAL | Status: DC
  Administered 2012-11-14 – 2012-11-15 (×2): 0.1 mg via ORAL
  Filled 2012-11-13 (×3): qty 1

## 2012-11-13 MED ORDER — ONDANSETRON HCL 4 MG/2ML IJ SOLN
INTRAMUSCULAR | Status: AC
  Filled 2012-11-13: qty 2

## 2012-11-13 MED ORDER — DOCUSATE SODIUM 100 MG PO CAPS: 100.0000 mg | ORAL_CAPSULE | Freq: Every day | ORAL | Status: DC | PRN

## 2012-11-13 MED ORDER — PROMETHAZINE HCL 25 MG/ML IJ SOLN: 6.2500 mg | INTRAMUSCULAR | Status: DC | PRN

## 2012-11-13 MED ORDER — DEXTROSE IN LACTATED RINGERS 5 % IV SOLN
INTRAVENOUS | Status: DC
  Administered 2012-11-13 (×2): via INTRAVENOUS

## 2012-11-13 MED ORDER — DEXAMETHASONE SODIUM PHOSPHATE 10 MG/ML IJ SOLN
INTRAMUSCULAR | Status: DC | PRN
  Administered 2012-11-13: 10 mg via INTRAVENOUS

## 2012-11-13 MED ORDER — HYDROMORPHONE HCL PF 1 MG/ML IJ SOLN
0.2500 mg | INTRAMUSCULAR | Status: DC | PRN
  Administered 2012-11-13: 0.5 mg via INTRAVENOUS

## 2012-11-13 MED ORDER — TEMAZEPAM 15 MG PO CAPS: 15.0000 mg | ORAL_CAPSULE | Freq: Every evening | ORAL | Status: DC | PRN

## 2012-11-13 MED ORDER — LACTATED RINGERS IV SOLN
INTRAVENOUS | Status: DC
  Administered 2012-11-13 (×3): via INTRAVENOUS

## 2012-11-13 MED ORDER — HYDROMORPHONE HCL PF 1 MG/ML IJ SOLN
INTRAMUSCULAR | Status: DC | PRN
  Administered 2012-11-13 (×2): 0.5 mg via INTRAVENOUS

## 2012-11-13 MED ORDER — PROPOFOL 10 MG/ML IV EMUL
INTRAVENOUS | Status: AC
  Filled 2012-11-13: qty 20

## 2012-11-13 MED ORDER — ROCURONIUM BROMIDE 100 MG/10ML IV SOLN
INTRAVENOUS | Status: DC | PRN
  Administered 2012-11-13: 50 mg via INTRAVENOUS

## 2012-11-13 MED ORDER — CEFAZOLIN SODIUM-DEXTROSE 2-3 GM-% IV SOLR
2.0000 g | INTRAVENOUS | Status: AC
  Administered 2012-11-13: 2 g via INTRAVENOUS

## 2012-11-13 MED ORDER — TRAMADOL HCL 50 MG PO TABS
50.0000 mg | ORAL_TABLET | Freq: Four times a day (QID) | ORAL | Status: DC | PRN
  Administered 2012-11-14 (×2): 50 mg via ORAL
  Filled 2012-11-13 (×2): qty 1

## 2012-11-13 MED ORDER — MIDAZOLAM HCL 2 MG/2ML IJ SOLN
INTRAMUSCULAR | Status: AC
  Filled 2012-11-13: qty 2

## 2012-11-13 SURGICAL SUPPLY — 39 items
ADH SKN CLS APL DERMABOND .7 (GAUZE/BANDAGES/DRESSINGS)
BRR ADH 6X5 SEPRAFILM 1 SHT (MISCELLANEOUS)
CANISTER SUCTION 2500CC (MISCELLANEOUS) ×3 IMPLANT
CHLORAPREP W/TINT 26ML (MISCELLANEOUS) ×3 IMPLANT
CLOTH BEACON ORANGE TIMEOUT ST (SAFETY) ×3 IMPLANT
DECANTER SPIKE VIAL GLASS SM (MISCELLANEOUS) ×1 IMPLANT
DERMABOND ADVANCED (GAUZE/BANDAGES/DRESSINGS)
DERMABOND ADVANCED .7 DNX12 (GAUZE/BANDAGES/DRESSINGS) ×2 IMPLANT
DRSG COVADERM 4X10 (GAUZE/BANDAGES/DRESSINGS) ×2 IMPLANT
ELECT BLADE 6 FLAT ULTRCLN (ELECTRODE) ×1 IMPLANT
GAUZE SPONGE 4X4 16PLY XRAY LF (GAUZE/BANDAGES/DRESSINGS) ×1 IMPLANT
GLOVE BIO SURGEON STRL SZ 6.5 (GLOVE) ×3 IMPLANT
GLOVE BIOGEL PI IND STRL 7.0 (GLOVE) ×2 IMPLANT
GLOVE BIOGEL PI INDICATOR 7.0 (GLOVE) ×1
GOWN PREVENTION PLUS LG XLONG (DISPOSABLE) ×9 IMPLANT
NEEDLE HYPO 22GX1.5 SAFETY (NEEDLE) ×1 IMPLANT
NS IRRIG 1000ML POUR BTL (IV SOLUTION) ×3 IMPLANT
PACK ABDOMINAL GYN (CUSTOM PROCEDURE TRAY) ×3 IMPLANT
PAD OB MATERNITY 4.3X12.25 (PERSONAL CARE ITEMS) ×3 IMPLANT
PROTECTOR NERVE ULNAR (MISCELLANEOUS) ×3 IMPLANT
SEPRAFILM MEMBRANE 5X6 (MISCELLANEOUS) IMPLANT
SPONGE LAP 18X18 X RAY DECT (DISPOSABLE) IMPLANT
STAPLER VISISTAT 35W (STAPLE) ×1 IMPLANT
SUT PDS AB 0 CT 36 (SUTURE) IMPLANT
SUT PDS AB 0 CTX 60 (SUTURE) IMPLANT
SUT PLAIN 2 0 XLH (SUTURE) ×1 IMPLANT
SUT VIC AB 0 CT1 18XCR BRD8 (SUTURE) ×4 IMPLANT
SUT VIC AB 0 CT1 27 (SUTURE) ×12
SUT VIC AB 0 CT1 27XBRD ANBCTR (SUTURE) ×8 IMPLANT
SUT VIC AB 0 CT1 8-18 (SUTURE) ×6
SUT VIC AB 3-0 PS1 18 (SUTURE)
SUT VIC AB 3-0 PS1 18X BRD (SUTURE) IMPLANT
SUT VIC AB 4-0 KS 27 (SUTURE) ×3 IMPLANT
SUT VICRYL 0 TIES 12 18 (SUTURE) ×3 IMPLANT
SYR CONTROL 10ML LL (SYRINGE) ×1 IMPLANT
SYRINGE 10CC LL (SYRINGE) IMPLANT
TOWEL OR 17X24 6PK STRL BLUE (TOWEL DISPOSABLE) ×6 IMPLANT
TRAY FOLEY CATH 14FR (SET/KITS/TRAYS/PACK) ×3 IMPLANT
WATER STERILE IRR 1000ML POUR (IV SOLUTION) ×2 IMPLANT

## 2012-11-13 NOTE — Brief Op Note (Signed)
11/13/2012  8:38 AM  PATIENT:  Julia Haas  37 y.o. female  PRE-OPERATIVE DIAGNOSIS:  polycystic ovarian syndrome, Pelvic Pain  POST-OPERATIVE DIAGNOSIS:  polycystic ovarian syndrome, Pelvic Pain  PROCEDURE:  Procedure(s): HYSTERECTOMY ABDOMINAL (N/A) SALPINGO OOPHORECTOMY (Bilateral)  SURGEON:  Surgeon(s) and Role:    * Jeani Hawking, MD - Primary    * Leslie Andrea, MD - Assisting  PHYSICIAN ASSISTANT:   ASSISTANTS: none   ANESTHESIA:   general  EBL:  Total I/O In: 2200 [I.V.:2200] Out: 275 [Urine:75; Blood:200]  BLOOD ADMINISTERED:none  DRAINS: Urinary Catheter (Foley)   LOCAL MEDICATIONS USED:  NONE  SPECIMEN:  Source of Specimen:  uterus cervix tubes and ovaries  DISPOSITION OF SPECIMEN:  PATHOLOGY  COUNTS:  YES  TOURNIQUET:  * No tourniquets in log *  DICTATION: .Other Dictation: Dictation Number [id number]  PLAN OF CARE: Admit to inpatient   PATIENT DISPOSITION:  PACU - hemodynamically stable.   Delay start of Pharmacological VTE agent (>24hrs) due to surgical blood loss or risk of bleeding: not applicable

## 2012-11-13 NOTE — Anesthesia Postprocedure Evaluation (Signed)
  Anesthesia Post-op Note  Patient: Julia Haas  Procedure(s) Performed: Procedure(s): HYSTERECTOMY ABDOMINAL (N/A) SALPINGO OOPHORECTOMY (Bilateral) Patient is awake and responsive. Pain and nausea are reasonably well controlled. Vital signs are stable and clinically acceptable. Oxygen saturation is clinically acceptable. There are no apparent anesthetic complications at this time. Patient is ready for discharge.

## 2012-11-13 NOTE — H&P (Signed)
37 year old G 0 here for TAH and BSO. She has been on Depo Provera for about 10 years. She does have an issue with PCOS. Because of the Depo Provera she has developed hot flashes and weight gain. She does have periods of time when she has cramping, more on the right side. She also complains of decreased libido.   Med History POTS IBS  Surgery History  Breast recduction 2011 Carpal Tunnel  Meds  Depo Provera Neurontin  Allergies Toradol,  Indocin  Social History No tobacco Married  ROS as above  Afebrile VSS General alert and oriented Lung CTAB Car RRR Abdomen is soft and non tender Pelvic Vagina [facility] Cervix no lesions Uterus deep in pelvis  Ultrasound  Retroverted uterus Uterus deviates to the left Multiple follicular cysts Ovary is displaced superior to the uterus  IMPRESSION: Pelvic Pain PCOS  PLAN: TAH and BSO Risks reviewed Consent signed.l

## 2012-11-13 NOTE — Anesthesia Postprocedure Evaluation (Signed)
  Anesthesia Post-op Note  Patient: Julia Haas  Procedure(s) Performed: Procedure(s): HYSTERECTOMY ABDOMINAL (N/A) SALPINGO OOPHORECTOMY (Bilateral)  Patient Location: PACU and Women's Unit  Anesthesia Type:General  Level of Consciousness: awake, alert  and oriented  Airway and Oxygen Therapy: Patient Spontanous Breathing  Post-op Pain: mild  Post-op Assessment: Patient's Cardiovascular Status Stable, Respiratory Function Stable, No signs of Nausea or vomiting and Pain level controlled  Post-op Vital Signs: stable  Complications: No apparent anesthesia complications

## 2012-11-13 NOTE — Op Note (Signed)
NAMEDELMA, DRONE                ACCOUNT NO.:  [id number]  MEDICAL RECORD NO.:  [id number]  LOCATION:  9317                          FACILITY:  [facility]  PHYSICIAN:  Amaan Meyer L. Giavanni Odonovan, M.D.DATE OF BIRTH:  07-Sep-1975  DATE OF PROCEDURE:  11/13/2012 DATE OF DISCHARGE:                              OPERATIVE REPORT   PREOPERATIVE DIAGNOSIS:  Pelvic pain and polycystic ovary syndrome.  POSTOPERATIVE DIAGNOSIS:  Pelvic pain and polycystic ovary syndrome.  PROCEDURE:  Total abdominal hysterectomy and bilateral saphingo- oophorectomy.  SURGEON:  Amontae Ng L. Vincente Poli, M.D.  ASSISTANT:  Guy Sandifer. Henderson Cloud, M.D.  ANESTHESIA:  General.  EBL:  Less than 100 mL.  COMPLICATIONS:  None.  DRAINS:  Foley catheter.  PATHOLOGY SPECIMEN:  Uterus, cervix, tubes, and ovaries.  PROCEDURE:  The patient was taken to the operating room.  She had been thoroughly consented several times in my office, and all risks of the procedure were reviewed with the patient.  The consent was signed.  She was taken to the operating room, and she was intubated.  She was prepped and draped in usual sterile fashion.  A low-transverse incision was made, carried down to the fascia.  Fascia was scored in the midline, extended laterally.  She did have significant amount of subcutaneous tissue.  The fascia scored in the midline extended laterally.  The rectus muscles were separated in the midline.  The peritoneum was entered bluntly.  A self-retaining retractor was placed in the abdominal cavity.  The large and small bowel were carefully packed in the upper abdomen.  Exam of the pelvis revealed that she had ovaries consistent with PCOS.  She had no adhesions and no endometriosis.  The uterus was small.  We then used a suture, suture ligated the round ligament on the right side and then on the left side, developed the broad ligament with Metzenbaum scissors.  On the right side, developed the space just beneath the  infundibulopelvic ligament.  With careful attention, the ureter was deep in the pelvis, placed a curved Heaney clamp across the IP ligament on the right side.  The pedicle was clamped, cut, and suture ligated using 0-Vicryl suture and further secured with 0-Vicryl suture. This was done on the left side in identical fashion.  We then developed and skeletonized the uterine artery.  The uterine artery was skeletonized on either side.  The bladder flap was created in standard fashion with sharp and blunt dissection, and we placed curved Heaney clamps across the uterine artery at the level of the internal os.  Each pedicle was clamped, cut, and suture ligated using 0-Vicryl suture.  We then placed straight Heaney clamps across the cervix and the uterosacral cardinal ligament complexes.  Each pedicle was clamped, cut, and suture ligated using 0-Vicryl suture.  The cervix was very small.  We placed curved Heaney clamps just beneath the cervix, removed the specimen, identified the tubes, ovaries, uterus, and cervix.  The angled stitches were placed using 0-Vicryl suture and the middle of the cuff was closed using figure-of-eight using 0-Vicryl suture.  Irrigation was performed. Hemostasis was excellent.  All instruments and laparotomy pads were removed.  The peritoneum was closed using  0-Vicryl.  The fascia was closed using 0 Vicryl starting each corner and meeting in the midline with a running stitch.  Plain gut suture was used to close the significant amount of subcutaneous tissue.  The skin was closed with staples.  I think she should wear the staples for 1 week.  All sponge, lap, and instrument counts were correct x2.  The patient went to the recovery room in a stable condition.     Cecia Egge L. Vincente Poli, M.D.     Florestine Avers  D:  11/13/2012  T:  11/13/2012  Job:  [id number]

## 2012-11-13 NOTE — Progress Notes (Signed)
Ur chart review completed.  

## 2012-11-13 NOTE — Anesthesia Preprocedure Evaluation (Addendum)
Anesthesia Evaluation  Patient identified by MRN, date of birth, ID band Patient awake    Reviewed: Allergy & Precautions, H&P , NPO status , Patient's Chart, lab work & pertinent test results  Airway Mallampati: III TM Distance: >3 FB Neck ROM: full    Dental no notable dental hx. (+) Teeth Intact   Pulmonary sleep apnea and Continuous Positive Airway Pressure Ventilation ,  Setting unknown. Will use 4cm H2O after surgery   Pulmonary exam normal       Cardiovascular     Neuro/Psych negative neurological ROS  negative psych ROS   GI/Hepatic negative GI ROS, Neg liver ROS,   Endo/Other  Morbid obesity  Renal/GU negative Renal ROS  negative genitourinary   Musculoskeletal negative musculoskeletal ROS (+)   Abdominal (+) + obese,   Peds negative pediatric ROS (+)  Hematology negative hematology ROS (+)   Anesthesia Other Findings   Reproductive/Obstetrics negative OB ROS                           Anesthesia Physical Anesthesia Plan  ASA: III  Anesthesia Plan: General   Post-op Pain Management:    Induction: Intravenous  Airway Management Planned: Oral ETT  Additional Equipment:   Intra-op Plan:   Post-operative Plan: Extubation in OR  Informed Consent: I have reviewed the patients History and Physical, chart, labs and discussed the procedure including the risks, benefits and alternatives for the proposed anesthesia with the patient or authorized representative who has indicated his/her understanding and acceptance.   Dental Advisory Given  Plan Discussed with: CRNA, Anesthesiologist and Surgeon  Anesthesia Plan Comments:         Anesthesia Quick Evaluation

## 2012-11-13 NOTE — Transfer of Care (Signed)
Immediate Anesthesia Transfer of Care Note  Patient: Julia Haas  Procedure(s) Performed: Procedure(s): HYSTERECTOMY ABDOMINAL (N/A) SALPINGO OOPHORECTOMY (Bilateral)  Patient Location: PACU  Anesthesia Type:General  Level of Consciousness: sedated  Airway & Oxygen Therapy: Patient Spontanous Breathing and Patient connected to nasal cannula oxygen  Post-op Assessment: Report given to PACU RN and Post -op Vital signs reviewed and stable  Post vital signs: stable  Complications: No apparent anesthesia complications

## 2012-11-14 ENCOUNTER — Encounter (HOSPITAL_COMMUNITY): Payer: Self-pay | Admitting: Obstetrics and Gynecology

## 2012-11-14 LAB — CBC
HCT: 35.7 % — ABNORMAL LOW (ref 36.0–46.0)
Platelets: 277 10*3/uL (ref 150–400)
RDW: 12.8 % (ref 11.5–15.5)
WBC: 13.3 10*3/uL — ABNORMAL HIGH (ref 4.0–10.5)

## 2012-11-14 LAB — BASIC METABOLIC PANEL
Chloride: 106 mEq/L (ref 96–112)
Creatinine, Ser: 0.62 mg/dL (ref 0.50–1.10)
GFR calc Af Amer: >90 mL/min (ref >90)

## 2012-11-14 MED ORDER — OXYCODONE-ACETAMINOPHEN 5-325 MG PO TABS
1.0000 | ORAL_TABLET | ORAL | Status: DC | PRN
  Administered 2012-11-14: 2 via ORAL
  Administered 2012-11-14: 1 via ORAL
  Administered 2012-11-14 – 2012-11-15 (×4): 2 via ORAL
  Filled 2012-11-14 (×3): qty 2
  Filled 2012-11-14: qty 1
  Filled 2012-11-14 (×2): qty 2

## 2012-11-14 NOTE — Progress Notes (Signed)
S: Patient doing well. No complaints. Ambulating  O:  Afebrile VSS Abdomen is soft and non tender Bandage is clean and dry  IMPRESSION: POD #1 TAH and BSO  PLAN: Doing very well Discontinue Foley and IV and Dilaudid Percocet Ambulate Advance diet Discharge tomorrow

## 2012-11-15 MED ORDER — OXYCODONE-ACETAMINOPHEN 5-325 MG PO TABS: 1.0000 | ORAL_TABLET | ORAL | Status: DC | PRN

## 2012-11-15 NOTE — Progress Notes (Signed)
2 Days Post-Op Procedure(s) (LRB): HYSTERECTOMY ABDOMINAL (N/A) SALPINGO OOPHORECTOMY (Bilateral)  Subjective: Patient reports tolerating PO, + flatus and no problems voiding.    Objective: I have reviewed patient's vital signs, intake and output, medications and labs.  General: alert and cooperative Abdomen is soft and non tender  Assessment: s/p Procedure(s): HYSTERECTOMY ABDOMINAL (N/A) SALPINGO OOPHORECTOMY (Bilateral): stable, progressing well and tolerating diet  Plan: Advance diet Discharge home Rx Percocet  LOS: 2 days    Julia Haas L 11/15/2012, 8:13 AM

## 2012-11-15 NOTE — Progress Notes (Signed)
Pt discharged to home with husband and mother.  Condition stable.  Pt to car via wheelchair with Normand Sloop, RN.  Pt home with abdominal binder on.  No equipment for home ordered at discharge.

## 2012-11-15 NOTE — Discharge Summary (Signed)
Admission Diagnosis: Pelvic Pain PCOS  Discharge Diagnosis: Same  Hospital Course: 37 year old G 0 with chronic pelvic pain and PCOS. She underwent TAH and BSO. She did very well post op. Post op Hemoglobin was normal. By POD #2 she was ambulating, voiding, tolerating regular diet.  She was discharged home in good condition on POD #2. Rx Percocet given to the patient Given standard discharge precautions. Follow up in 1 week

## 2012-11-15 NOTE — Plan of Care (Signed)
Problem: Discharge Progression Outcomes Goal: Discontinue staples (if applicable) Outcome: Not Applicable Date Met:  11/15/12 Pt going to office on 11-20-12 for staple removal.

## 2013-10-16 ENCOUNTER — Other Ambulatory Visit: Payer: Self-pay | Admitting: Obstetrics and Gynecology

## 2013-10-17 LAB — CYTOLOGY - PAP

## 2018-08-26 ENCOUNTER — Encounter (INDEPENDENT_AMBULATORY_CARE_PROVIDER_SITE_OTHER): Payer: Self-pay | Admitting: Orthopedic Surgery

## 2018-08-26 ENCOUNTER — Ambulatory Visit (INDEPENDENT_AMBULATORY_CARE_PROVIDER_SITE_OTHER): Payer: BLUE CROSS/BLUE SHIELD | Admitting: Orthopedic Surgery

## 2018-08-26 ENCOUNTER — Other Ambulatory Visit: Payer: Self-pay

## 2018-08-26 ENCOUNTER — Ambulatory Visit (INDEPENDENT_AMBULATORY_CARE_PROVIDER_SITE_OTHER): Payer: Self-pay

## 2018-08-26 DIAGNOSIS — M79672 Pain in left foot: Secondary | ICD-10-CM

## 2018-08-26 NOTE — Progress Notes (Signed)
Office Visit Note   Patient: Julia Haas           Date of Birth: 04/20/1976           MRN: [id number] Visit Date: 08/26/2018 Requested by: Clovis RileyMitchell, L.August Saucerean, MD [address] [place], [place] [address] PCP: Clovis RileyMitchell, L.August Saucerean, MD  Subjective: Chief Complaint  Patient presents with  . Left Foot - Pain    HPI: Julia Haas is a patient is 4 weeks out from stepping off a tractor and injuring her left foot.  She had swelling at the time.  Skin a little bit better over the past several days.  She is wearing fairly hearty shoes.              ROS: All systems reviewed are negative as they relate to the chief complaint within the history of present illness.  Patient denies  fevers or chills.   Assessment & Plan: Visit Diagnoses:  1. Pain in left foot     Plan: Impression is transverse fracture nondisplaced left fifth metatarsal.  Callus formation is present.  Plan is to continue weightbearing as tolerated in that type of food that she is on.  Should be relatively resolved in about 3 more weeks.  Follow-up with me as needed  Follow-Up Instructions: Return if symptoms worsen or fail to improve.   Orders:  Orders Placed This Encounter  Procedures  . XR Foot Complete Left   No orders of the defined types were placed in this encounter.     Procedures: No procedures performed   Clinical Data: No additional findings.  Objective: Vital Signs: There were no vitals taken for this visit.  Physical Exam:   Constitutional: Patient appears well-developed HEENT:  Head: Normocephalic Eyes:EOM are normal Neck: Normal range of motion Cardiovascular: Normal rate Pulmonary/chest: Effort normal Neurologic: Patient is alert Skin: Skin is warm Psychiatric: Patient has normal mood and affect    Ortho Exam: Ortho exam demonstrates full active and passive range of motion of the knee and ankle.  Ankle is stable on the left-hand side.  Does have mild tenderness at the metatarsal  shafts #5 to palpation.  Toe motion is intact.  Pedal pulses palpable  Specialty Comments:  No specialty comments available.  Imaging: Xr Foot Complete Left  Result Date: 08/26/2018 AP lateral oblique left foot reviewed.  Mild bunion is present.  Mild degenerative joint disease is present at the first MTP articulation.  Tarsometatarsal alignment intact.  There is evidence of a nondisplaced healing transverse fracture at the base of the fifth metatarsal neck.  Callus formation is present around this linear fracture.    PMFS History: Patient Active Problem List   Diagnosis Date Noted  . Morbid obesity (HCC) 01/31/2012  . CHEST PAIN, ATYPICAL 11/24/2009  . OBSTRUCTIVE SLEEP APNEA 09/08/2009  . ORTHOSTATIC HYPOTENSION 09/08/2009  . SINUS TACHYCARDIA 08/04/2009  . HYPERTENSION 08/03/2009   Past Medical History:  Diagnosis Date  . Costochondritis    improved with breast reduction surgery  . Dizziness    orthostatic  . Dysrhythmia    tachycardia related to POTS  . Migraines   . Neuromuscular disorder (HCC)    trigeminal nerve pain  . Obesity   . OSA (obstructive sleep apnea)    mild, improved with CPAP  . POTS (postural orthostatic tachycardia syndrome)   . Shortness of breath    on exertion  . Sinus tachycardia     Family History  Problem Relation Age of Onset  .  Coronary artery disease Father   . Hypertension Father   . Heart attack Paternal Grandfather     Past Surgical History:  Procedure Laterality Date  . ABDOMINAL HYSTERECTOMY N/A 11/13/2012   Procedure: HYSTERECTOMY ABDOMINAL;  Surgeon: Jeani Hawking, MD;  Location: [facility];  Service: Gynecology;  Laterality: N/A;  . bilateral breast reduction    . bladder stem shortening     at age 78  . CARPAL TUNNEL RELEASE    . SALPINGOOPHORECTOMY Bilateral 11/13/2012   Procedure: SALPINGO OOPHORECTOMY;  Surgeon: Jeani Hawking, MD;  Location: [facility];  Service: Gynecology;  Laterality: Bilateral;   Social History    Occupational History  . Occupation: works at [employer] for [employer].  Tobacco Use  . Smoking status: Never Smoker  Substance and Sexual Activity  . Alcohol use: Yes    Comment: rare  . Drug use: No  . Sexual activity: Not on file

## 2020-05-03 ENCOUNTER — Other Ambulatory Visit: Payer: Self-pay | Admitting: Gastroenterology

## 2020-05-03 DIAGNOSIS — R1311 Dysphagia, oral phase: Secondary | ICD-10-CM

## 2020-05-13 ENCOUNTER — Ambulatory Visit
Admission: RE | Admit: 2020-05-13 | Discharge: 2020-05-13 | Disposition: A | Discharge status: Home / Self Care | Payer: BC Managed Care – PPO | Source: Ambulatory Visit | Attending: Gastroenterology | Admitting: Gastroenterology

## 2020-05-13 ENCOUNTER — Other Ambulatory Visit: Payer: Self-pay

## 2020-05-13 ENCOUNTER — Other Ambulatory Visit: Payer: Self-pay | Admitting: Gastroenterology

## 2020-05-13 DIAGNOSIS — R1311 Dysphagia, oral phase: Secondary | ICD-10-CM

## 2023-03-19 ENCOUNTER — Other Ambulatory Visit: Payer: Self-pay | Admitting: Family

## 2023-03-19 DIAGNOSIS — N63 Unspecified lump in unspecified breast: Secondary | ICD-10-CM

## 2023-03-29 ENCOUNTER — Ambulatory Visit
Admission: RE | Admit: 2023-03-29 | Discharge: 2023-03-29 | Disposition: A | Discharge status: Home / Self Care | Payer: BC Managed Care – PPO | Source: Ambulatory Visit | Attending: Family | Admitting: Family

## 2023-03-29 ENCOUNTER — Other Ambulatory Visit: Payer: Self-pay | Admitting: Family

## 2023-03-29 DIAGNOSIS — N61 Mastitis without abscess: Secondary | ICD-10-CM

## 2023-03-29 DIAGNOSIS — N63 Unspecified lump in unspecified breast: Secondary | ICD-10-CM

## 2023-04-06 ENCOUNTER — Other Ambulatory Visit: Payer: Self-pay | Admitting: Family

## 2023-04-06 ENCOUNTER — Ambulatory Visit
Admission: RE | Admit: 2023-04-06 | Discharge: 2023-04-06 | Disposition: A | Discharge status: Home / Self Care | Payer: BC Managed Care – PPO | Source: Ambulatory Visit | Attending: Family | Admitting: Family

## 2023-04-06 ENCOUNTER — Other Ambulatory Visit (HOSPITAL_COMMUNITY)
Admission: RE | Admit: 2023-04-06 | Discharge: 2023-04-06 | Disposition: A | Discharge status: Home / Self Care | Payer: BC Managed Care – PPO | Source: Other Acute Inpatient Hospital | Attending: Family | Admitting: Family

## 2023-04-06 DIAGNOSIS — N61 Mastitis without abscess: Secondary | ICD-10-CM | POA: Diagnosis present

## 2023-04-06 HISTORY — PX: BREAST BIOPSY: SHX20

## 2023-04-09 LAB — SURGICAL PATHOLOGY

## 2023-04-11 LAB — AEROBIC/ANAEROBIC CULTURE W GRAM STAIN (SURGICAL/DEEP WOUND)
Culture: NO GROWTH
Gram Stain: NONE SEEN

## 2023-04-16 ENCOUNTER — Ambulatory Visit: Payer: Self-pay | Admitting: Surgery

## 2023-04-16 NOTE — H&P (View-Only) (Signed)
 Subjective   Chief Complaint: Breast Problem (Left breast abscess)     History of Present Illness: Julia Haas is a 47 y.o. female who is seen today as an office consultation at the request of Dr. Maren Reamer for evaluation of Breast Problem (Left breast abscess) .    This is a 47 year old female status post remote history of bilateral reduction mammoplasty who presents with a 4-week history of subareolar left breast erythema and tenderness.  She was treated with 2 separate courses of antibiotics with minimal improvement.  Diagnostic mammogram and ultrasound on 11/14 showed cellulitis.  Follow-up ultrasound on 04/06/2023 revealed small pockets of fluid and hyperemic tissue.  These are located at 2:00 in the left breast 2 cm from the nipple.  She underwent aspiration as well as tissue sampling.  Tissue sampling showed only inflamed breast tissue.  The aspiration showed no growth.  The area continues to be erythematous and tender.  She has begun to have some spontaneous purulent drainage.  However the mass continues to feel like it is about 5 cm across.  Currently she is not on any antibiotics.   Review of Systems: A complete review of systems was obtained from the patient.  I have reviewed this information and discussed as appropriate with the patient.  See HPI as well for other ROS.  Review of Systems  Constitutional: Negative.   HENT:  Positive for congestion.   Eyes: Negative.   Respiratory: Negative.    Cardiovascular: Negative.   Gastrointestinal: Negative.   Genitourinary: Negative.   Musculoskeletal: Negative.   Skin:  Positive for rash.  Neurological: Negative.   Endo/Heme/Allergies: Negative.   Psychiatric/Behavioral: Negative.        Medical History: History reviewed. No pertinent past medical history.  Patient Active Problem List  Diagnosis   Left breast abscess    Past Surgical History:  Procedure Laterality Date   ENDOSCOPIC CARPAL TUNNEL RELEASE      HYSTERECTOMY     REDUCTION MAMMAPLASTY       Allergies  Allergen Reactions   Indomethacin Other (See Comments)   Ketorolac Other (See Comments)    Unknown reaction    Current Outpatient Medications on File Prior to Visit  Medication Sig Dispense Refill   omeprazole (PRILOSEC) 20 MG DR capsule Take 20 mg by mouth once daily     No current facility-administered medications on file prior to visit.    Family History  Problem Relation Age of Onset   Obesity Mother    Hyperlipidemia (Elevated cholesterol) Mother    High blood pressure (Hypertension) Father    Coronary Artery Disease (Blocked arteries around heart) Father      Social History   Tobacco Use  Smoking Status Never  Smokeless Tobacco Never     Social History   Socioeconomic History   Marital status: Married  Tobacco Use   Smoking status: Never   Smokeless tobacco: Never  Substance and Sexual Activity   Alcohol use: Yes   Drug use: Never    Objective:    Vitals:   04/16/23 0941 04/16/23 0942  BP: (!) 140/98   Pulse: (!) 120   Temp: 37 C (98.6 F)   SpO2: 97%   Weight: (!) 122.9 kg (271 lb)   Height: 172.7 cm (5\' 8" )   PainSc:    4  PainLoc:  Breast    Body mass index is 41.21 kg/m.  Physical Exam   Constitutional:  WDWN in NAD, conversant, no obvious deformities; lying in  bed comfortably Eyes:  Pupils equal, round; sclera anicteric; moist conjunctiva; no lid lag HENT:  Oral mucosa moist; good dentition  Neck:  No masses palpated, trachea midline; no thyromegaly Lungs:  CTA bilaterally; normal respiratory effort Breasts:  symmetric, no nipple changes; left breast just above the edge of the areola there is a 3 cm area of deep erythema.  There is a 5 cm area of firmness behind this erythema.  There is a pinpoint opening with some minimal purulent drainage.  No other breast masses.  All of her mammoplasty incisions are otherwise well-healed CV:  Regular rate and rhythm; no murmurs; extremities  well-perfused with no edema Abd:  +bowel sounds, soft, non-tender, no palpable organomegaly; no palpable hernias Musc: Normal gait; no apparent clubbing or cyanosis in extremities Lymphatic:  No palpable cervical or axillary lymphadenopathy Skin:  Warm, dry; no sign of jaundice Psychiatric - alert and oriented x 4; calm mood and affect   Labs, Imaging and Diagnostic Testing: CLINICAL DATA:  47 year old with a remote personal history of BILATERAL reduction mammoplasty, presenting with a palpable lump in the outer subareolar LEFT breast associated with skin erythema and warmth for approximately 2 weeks, unresponsive to a 7 day course of sulfamethoxazole. The patient is currently on day 3 of a 10 day course of cephalexin. Annual evaluation, RIGHT breast.   EXAM: DIGITAL DIAGNOSTIC BILATERAL MAMMOGRAM WITH TOMOSYNTHESIS AND CAD; ULTRASOUND LEFT BREAST LIMITED   TECHNIQUE: Bilateral digital diagnostic mammography and breast tomosynthesis was performed. The images were evaluated with computer-aided detection. ; Targeted ultrasound examination of the left breast was performed.   COMPARISON:  Previous exams, most recently 03/08/2015.   ACR Breast Density Category a: The breasts are almost entirely fatty.   FINDINGS: Full field CC and MLO views of both breasts and a spot tangential view of the palpable concern in the LEFT breast were obtained. The site of palpable concern marked with a metallic BB.   RIGHT: No findings suspicious for malignancy. Scar/distortion related to the prior reduction mammoplasty with associated benign dystrophic calcifications.   LEFT: Mass/asymmetry in the outer subareolar location in the area of palpable concern measuring just over 2 cm in size, with associated scattered calcifications. The mass/asymmetry is directly anterior to fat necrosis with coarse calcifications, unchanged since the prior examination in 2016. The calcifications within the  mass/asymmetry are new since that examination.   No new or suspicious findings elsewhere.   Targeted ultrasound is performed in the area of palpable concern, demonstrating a heterogeneous appearance to the breast tissues in the upper outer subareolar location from 12 o'clock through 3 o'clock, located anterior to the dense calcifications from the fat necrosis identified on mammography. There is no abnormal fluid collection to suggest an abscess at this time. There is hyperemia on power Doppler evaluation. No suspicious solid mass is identified.   Sonographic evaluation of the LEFT axilla demonstrates no pathologic lymphadenopathy.   On correlative physical examination, the skin of the upper-outer periareolar breast is erythematous. There is a palpable approximate 5 cm mass, though the majority of this mass is related to the fat necrosis with coarse calcifications identified on mammography.   IMPRESSION: 1. Findings most consistent with cellulitis involving the upper outer subareolar LEFT breast. There is no evidence of abscess. 2. No pathologic LEFT axillary lymphadenopathy. 3. No mammographic evidence of malignancy involving the RIGHT breast.   RECOMMENDATION: The patient was counseled to complete the 10 day course of cephalexin and she will return on Friday, November 22,  for a follow-up ultrasound.   I have discussed the findings and recommendations with the patient. If applicable, a reminder letter will be sent to the patient regarding the next appointment.   BI-RADS CATEGORY  3: Probably benign.     Electronically Signed   By: Hulan Saas M.D.   On: 03/29/2023 09:44  CLINICAL DATA:  Left breast 2 o'clock suspected abscess.   The patient presents with increasing erythema and edema of her left breast, centered at the site of palpable concern at 2 o'clock, subareolar location. She has just finished 10 days course of Keflex.   History of prior reduction  mammoplasty.   EXAM: ULTRASOUND OF THE LEFT BREAST   COMPARISON:  Previous exam(s).   FINDINGS: On physical exam, there is redness and induration of the left breast centered at 2 o'clock, along the nipple-areolar border. Approximately 5-7 cm palpable mass could be felt at the site of redness.   Targeted left breast ultrasound is performed, showing 2 o'clock 2 cm from the nipple heterogeneous in echogenicity hyperemic tissue containing small pockets of fluid. Two pockets of fluid measuring 0.4 and 0.7 cm in diameter are seen within the hyperemic tissue. Overlying skin thickening is noted.   IMPRESSION: Left breast 2 o'clock probable evolving abscess.   RECOMMENDATION: Aspiration and tissue sampling of the left breast 2 o'clock.   I have discussed the findings and recommendations with the patient. If applicable, a reminder letter will be sent to the patient regarding the next appointment.   BI-RADS CATEGORY  3: Probably benign.     Electronically Signed   By: Ted Mcalpine M.D.   On: 04/06/2023 15:40    Assessment and Plan:  Diagnoses and all orders for this visit:  Left breast abscess    Recommend incision and drainage of the left breast abscess to completely evacuate the abscess cavity.  We will likely place a small Penrose drain.  The patient request that her surgery be scheduled for next week.  As long as the abscess is spontaneously draining, this should be fine.  If she stops having drainage and the erythema and swelling continued to increase, I recommended that she contact us urgently.    Dmiya Malphrus Delbert Harness, MD  04/16/2023 9:58 AM

## 2023-04-16 NOTE — H&P (Signed)
Subjective   Chief Complaint: Breast Problem (Left breast abscess)     History of Present Illness: Julia Haas is a 47 y.o. female who is seen today as an office consultation at the request of Dr. Maren Reamer for evaluation of Breast Problem (Left breast abscess) .    This is a 47 year old female status post remote history of bilateral reduction mammoplasty who presents with a 4-week history of subareolar left breast erythema and tenderness.  She was treated with 2 separate courses of antibiotics with minimal improvement.  Diagnostic mammogram and ultrasound on 11/14 showed cellulitis.  Follow-up ultrasound on 04/06/2023 revealed small pockets of fluid and hyperemic tissue.  These are located at 2:00 in the left breast 2 cm from the nipple.  She underwent aspiration as well as tissue sampling.  Tissue sampling showed only inflamed breast tissue.  The aspiration showed no growth.  The area continues to be erythematous and tender.  She has begun to have some spontaneous purulent drainage.  However the mass continues to feel like it is about 5 cm across.  Currently she is not on any antibiotics.   Review of Systems: A complete review of systems was obtained from the patient.  I have reviewed this information and discussed as appropriate with the patient.  See HPI as well for other ROS.  Review of Systems  Constitutional: Negative.   HENT:  Positive for congestion.   Eyes: Negative.   Respiratory: Negative.    Cardiovascular: Negative.   Gastrointestinal: Negative.   Genitourinary: Negative.   Musculoskeletal: Negative.   Skin:  Positive for rash.  Neurological: Negative.   Endo/Heme/Allergies: Negative.   Psychiatric/Behavioral: Negative.        Medical History: History reviewed. No pertinent past medical history.  Patient Active Problem List  Diagnosis   Left breast abscess    Past Surgical History:  Procedure Laterality Date   ENDOSCOPIC CARPAL TUNNEL RELEASE      HYSTERECTOMY     REDUCTION MAMMAPLASTY       Allergies  Allergen Reactions   Indomethacin Other (See Comments)   Ketorolac Other (See Comments)    Unknown reaction    Current Outpatient Medications on File Prior to Visit  Medication Sig Dispense Refill   omeprazole (PRILOSEC) 20 MG DR capsule Take 20 mg by mouth once daily     No current facility-administered medications on file prior to visit.    Family History  Problem Relation Age of Onset   Obesity Mother    Hyperlipidemia (Elevated cholesterol) Mother    High blood pressure (Hypertension) Father    Coronary Artery Disease (Blocked arteries around heart) Father      Social History   Tobacco Use  Smoking Status Never  Smokeless Tobacco Never     Social History   Socioeconomic History   Marital status: Married  Tobacco Use   Smoking status: Never   Smokeless tobacco: Never  Substance and Sexual Activity   Alcohol use: Yes   Drug use: Never    Objective:    Vitals:   04/16/23 0941 04/16/23 0942  BP: (!) 140/98   Pulse: (!) 120   Temp: 37 C (98.6 F)   SpO2: 97%   Weight: (!) 122.9 kg (271 lb)   Height: 172.7 cm (5\' 8" )   PainSc:    4  PainLoc:  Breast    Body mass index is 41.21 kg/m.  Physical Exam   Constitutional:  WDWN in NAD, conversant, no obvious deformities; lying in  bed comfortably Eyes:  Pupils equal, round; sclera anicteric; moist conjunctiva; no lid lag HENT:  Oral mucosa moist; good dentition  Neck:  No masses palpated, trachea midline; no thyromegaly Lungs:  CTA bilaterally; normal respiratory effort Breasts:  symmetric, no nipple changes; left breast just above the edge of the areola there is a 3 cm area of deep erythema.  There is a 5 cm area of firmness behind this erythema.  There is a pinpoint opening with some minimal purulent drainage.  No other breast masses.  All of her mammoplasty incisions are otherwise well-healed CV:  Regular rate and rhythm; no murmurs; extremities  well-perfused with no edema Abd:  +bowel sounds, soft, non-tender, no palpable organomegaly; no palpable hernias Musc: Normal gait; no apparent clubbing or cyanosis in extremities Lymphatic:  No palpable cervical or axillary lymphadenopathy Skin:  Warm, dry; no sign of jaundice Psychiatric - alert and oriented x 4; calm mood and affect   Labs, Imaging and Diagnostic Testing: CLINICAL DATA:  47 year old with a remote personal history of BILATERAL reduction mammoplasty, presenting with a palpable lump in the outer subareolar LEFT breast associated with skin erythema and warmth for approximately 2 weeks, unresponsive to a 7 day course of sulfamethoxazole. The patient is currently on day 3 of a 10 day course of cephalexin. Annual evaluation, RIGHT breast.   EXAM: DIGITAL DIAGNOSTIC BILATERAL MAMMOGRAM WITH TOMOSYNTHESIS AND CAD; ULTRASOUND LEFT BREAST LIMITED   TECHNIQUE: Bilateral digital diagnostic mammography and breast tomosynthesis was performed. The images were evaluated with computer-aided detection. ; Targeted ultrasound examination of the left breast was performed.   COMPARISON:  Previous exams, most recently 03/08/2015.   ACR Breast Density Category a: The breasts are almost entirely fatty.   FINDINGS: Full field CC and MLO views of both breasts and a spot tangential view of the palpable concern in the LEFT breast were obtained. The site of palpable concern marked with a metallic BB.   RIGHT: No findings suspicious for malignancy. Scar/distortion related to the prior reduction mammoplasty with associated benign dystrophic calcifications.   LEFT: Mass/asymmetry in the outer subareolar location in the area of palpable concern measuring just over 2 cm in size, with associated scattered calcifications. The mass/asymmetry is directly anterior to fat necrosis with coarse calcifications, unchanged since the prior examination in 2016. The calcifications within the  mass/asymmetry are new since that examination.   No new or suspicious findings elsewhere.   Targeted ultrasound is performed in the area of palpable concern, demonstrating a heterogeneous appearance to the breast tissues in the upper outer subareolar location from 12 o'clock through 3 o'clock, located anterior to the dense calcifications from the fat necrosis identified on mammography. There is no abnormal fluid collection to suggest an abscess at this time. There is hyperemia on power Doppler evaluation. No suspicious solid mass is identified.   Sonographic evaluation of the LEFT axilla demonstrates no pathologic lymphadenopathy.   On correlative physical examination, the skin of the upper-outer periareolar breast is erythematous. There is a palpable approximate 5 cm mass, though the majority of this mass is related to the fat necrosis with coarse calcifications identified on mammography.   IMPRESSION: 1. Findings most consistent with cellulitis involving the upper outer subareolar LEFT breast. There is no evidence of abscess. 2. No pathologic LEFT axillary lymphadenopathy. 3. No mammographic evidence of malignancy involving the RIGHT breast.   RECOMMENDATION: The patient was counseled to complete the 10 day course of cephalexin and she will return on Friday, November 22,  for a follow-up ultrasound.   I have discussed the findings and recommendations with the patient. If applicable, a reminder letter will be sent to the patient regarding the next appointment.   BI-RADS CATEGORY  3: Probably benign.     Electronically Signed   By: Hulan Saas M.D.   On: 03/29/2023 09:44  CLINICAL DATA:  Left breast 2 o'clock suspected abscess.   The patient presents with increasing erythema and edema of her left breast, centered at the site of palpable concern at 2 o'clock, subareolar location. She has just finished 10 days course of Keflex.   History of prior reduction  mammoplasty.   EXAM: ULTRASOUND OF THE LEFT BREAST   COMPARISON:  Previous exam(s).   FINDINGS: On physical exam, there is redness and induration of the left breast centered at 2 o'clock, along the nipple-areolar border. Approximately 5-7 cm palpable mass could be felt at the site of redness.   Targeted left breast ultrasound is performed, showing 2 o'clock 2 cm from the nipple heterogeneous in echogenicity hyperemic tissue containing small pockets of fluid. Two pockets of fluid measuring 0.4 and 0.7 cm in diameter are seen within the hyperemic tissue. Overlying skin thickening is noted.   IMPRESSION: Left breast 2 o'clock probable evolving abscess.   RECOMMENDATION: Aspiration and tissue sampling of the left breast 2 o'clock.   I have discussed the findings and recommendations with the patient. If applicable, a reminder letter will be sent to the patient regarding the next appointment.   BI-RADS CATEGORY  3: Probably benign.     Electronically Signed   By: Ted Mcalpine M.D.   On: 04/06/2023 15:40    Assessment and Plan:  Diagnoses and all orders for this visit:  Left breast abscess    Recommend incision and drainage of the left breast abscess to completely evacuate the abscess cavity.  We will likely place a small Penrose drain.  The patient request that her surgery be scheduled for next week.  As long as the abscess is spontaneously draining, this should be fine.  If she stops having drainage and the erythema and swelling continued to increase, I recommended that she contact us urgently.    Dmiya Malphrus Delbert Harness, MD  04/16/2023 9:58 AM

## 2023-04-19 ENCOUNTER — Encounter (HOSPITAL_COMMUNITY): Payer: Self-pay | Admitting: Surgery

## 2023-04-19 ENCOUNTER — Other Ambulatory Visit: Payer: Self-pay

## 2023-04-19 NOTE — Progress Notes (Signed)
PCP - ERICA ADAMS  Cardiologist -   PPM/ICD - denies Device Orders - n/a Rep Notified - n/a  Chest x-ray - denies EKG - DOS Stress Test - denies ECHO - 08-03-2009 Cardiac Cath - denies  CPAP - Sleep test more than 5 years age but does not use CPAP regularly   DM -denies  Blood Thinner Instructions: denies Aspirin Instructions: n/a  ERAS Protcol - clear liquids until 4:30 am  COVID TEST- no  Anesthesia review: Yes, Hx of POTS and sleep apnea  Patient verbally denies any shortness of breath, fever, cough and chest pain during phone call   -------------  SDW INSTRUCTIONS given:  Your procedure is scheduled on April 23, 2023.  Report to [facility] Main Entrance "A" at 5:30 A.M., and check in at the Admitting office.  Call this number if you have problems the morning of surgery:  [phone]   Remember:  Do not eat after midnight the night before your surgery  You may drink clear liquids until 4:30 the morning of your surgery.   Clear liquids allowed are: Water, Non-Citrus Juices (without pulp), Carbonated Beverages, Clear Tea, Black Coffee Only, and Gatorade    Take these medicines the morning of surgery with A SIP OF WATER  omeprazole (PRILOSEC OTC)    As of today, STOP taking any Aspirin (unless otherwise instructed by your surgeon) Aleve, Naproxen, Ibuprofen, Motrin, Advil, Goody's, BC's, all herbal medications, fish oil, and all vitamins.                      Do not wear jewelry, make up, or nail polish            Do not wear lotions, powders, perfumes/colognes, or deodorant.            Do not shave 48 hours prior to surgery.  Men may shave face and neck.            Do not bring valuables to the hospital.            [facility] is not responsible for any belongings or valuables.  Do NOT Smoke (Tobacco/Vaping) 24 hours prior to your procedure If you use a CPAP at night, you may bring all equipment for your overnight stay.   Contacts, glasses, dentures or  bridgework may not be worn into surgery.      For patients admitted to the hospital, discharge time will be determined by your treatment team.   Patients discharged the day of surgery will not be allowed to drive home, and someone needs to stay with them for 24 hours.    Special instructions:   [facility]- Preparing For Surgery  Before surgery, you can play an important role. Because skin is not sterile, your skin needs to be as free of germs as possible. You can reduce the number of germs on your skin by washing with CHG (chlorahexidine gluconate) Soap before surgery.  CHG is an antiseptic cleaner which kills germs and bonds with the skin to continue killing germs even after washing.    Oral Hygiene is also important to reduce your risk of infection.  Remember - BRUSH YOUR TEETH THE MORNING OF SURGERY WITH YOUR REGULAR TOOTHPASTE  Please do not use if you have an allergy to CHG or antibacterial soaps. If your skin becomes reddened/irritated stop using the CHG.  Do not shave (including legs and underarms) for at least 48 hours prior to first CHG shower. It is  OK to shave your face.  Please follow these instructions carefully.   Shower the NIGHT BEFORE SURGERY and the MORNING OF SURGERY with DIAL Soap.   Pat yourself dry with a CLEAN TOWEL.  Wear CLEAN PAJAMAS to bed the night before surgery  Place CLEAN SHEETS on your bed the night of your first shower and DO NOT SLEEP WITH PETS.   Day of Surgery: Please shower morning of surgery  Wear Clean/Comfortable clothing the morning of surgery Do not apply any deodorants/lotions.   Remember to brush your teeth WITH YOUR REGULAR TOOTHPASTE.   Questions were answered. Patient verbalized understanding of instructions.

## 2023-04-19 NOTE — Anesthesia Preprocedure Evaluation (Addendum)
Anesthesia Evaluation  Patient identified by MRN, date of birth, ID band Patient awake    Reviewed: Allergy & Precautions, NPO status , Patient's Chart, lab work & pertinent test results  History of Anesthesia Complications (+) PONV and history of anesthetic complications  Airway Mallampati: II  TM Distance: >3 FB Neck ROM: Full    Dental no notable dental hx. (+) Dental Advisory Given, Teeth Intact   Pulmonary shortness of breath, sleep apnea    Pulmonary exam normal breath sounds clear to auscultation       Cardiovascular hypertension, Normal cardiovascular exam+ dysrhythmias  Rhythm:Regular Rate:Normal     Neuro/Psych  Headaches  Neuromuscular disease    GI/Hepatic Neg liver ROS,GERD  Medicated and Controlled,,  Endo/Other    Class 3 obesity  Renal/GU negative Renal ROS     Musculoskeletal  (+) Arthritis ,    Abdominal  (+) + obese  Peds  Hematology negative hematology ROS (+)   Anesthesia Other Findings   Reproductive/Obstetrics                              Anesthesia Physical Anesthesia Plan  ASA: 3  Anesthesia Plan: General   Post-op Pain Management: Tylenol PO (pre-op)* and Gabapentin PO (pre-op)*   Induction: Intravenous  PONV Risk Score and Plan: 4 or greater and Ondansetron, Dexamethasone, Treatment may vary due to age or medical condition, Midazolam and Scopolamine patch - Pre-op  Airway Management Planned: LMA and Oral ETT  Additional Equipment:   Intra-op Plan:   Post-operative Plan: Extubation in OR  Informed Consent: I have reviewed the patients History and Physical, chart, labs and discussed the procedure including the risks, benefits and alternatives for the proposed anesthesia with the patient or authorized representative who has indicated his/her understanding and acceptance.     Dental advisory given  Plan Discussed with: CRNA  Anesthesia Plan  Comments: (PAT note written 04/19/2023 by Shonna Chock, PA-C.  )        Anesthesia Quick Evaluation

## 2023-04-19 NOTE — Progress Notes (Signed)
Anesthesia Chart Review: Julia Haas  Case: [id number] Date/Time: 04/23/23 0715   Procedure: INCISION AND DRAINAGE OF LEFT BREAST ABSCESS (Left) - LMA   Anesthesia type: General   Pre-op diagnosis: LEFT BREAST ABSCESS   Location: [facility] ROOM 01 / [facility]   Surgeons: Manus Rudd, MD       DISCUSSION: Patient is a 47 year old female scheduled for the above procedure.  History includes never smoker, post-operative N/V, POTS, sinus tachycardia, orthostatic dizziness, exertional dyspnea, OSA (inconsistent CPAP use), migraines, hysterectomy (TAH/BSO 11/13/12 for pelvis pain and PCOS), mammaplasty reduction (12/16/09).  She is a same-day workup.  Anesthesia team evaluation on the day of surgery.  Labs and EKG on arrival as indicated.   VS: Ht 5\' 7"  (1.702 m)   Wt 122.5 kg   BMI 42.29 kg/m   BP Readings from Last 3 Encounters:  11/15/12 128/89  01/31/12 (!) 132/98  12/28/10 134/88   Pulse Readings from Last 3 Encounters:  11/15/12 73  11/08/12 80  01/31/12 80     PROVIDERS: Oneal Grout, FNP is PCP  - She was previously followed by EP cardiologist Hillis Range, MD for ST and orthostatic hypotension with symptoms improved on BID Florinef. Last visit 01/31/12. She is no longer on Florinef based on current medication list.   LABS: For day of surgery as indicated.   IMAGES: [place] Left breast 04/06/23: IMPRESSION: Left breast 2 o'clock probable evolving abscess.   RECOMMENDATION: Aspiration and tissue sampling of the left breast 2 o'clock.   I have discussed the findings and recommendations with the patient. If applicable, a reminder letter will be sent to the patient regarding the next appointment.   BI-RADS CATEGORY  3: Probably benign.    EKG: Last EKG seen is for 01/31/12: NSR with sinus arrhythmia. Possible LAE.   CV: ETT 01/19/11: ETT Interpretation:  normal - no evidence of ischemia by ST analysis   Comments: Poor exercise tolerance. No chest pain. Normal BP  response to exercise. No ST-T changes to suggest ischemia.  Patient complained of lightheadedness at maximal effort.  Echocardiogram 04/21/09: Findings: Normal LV size and function. There is no gross regional wall motion abnormalities, although sensitivity is reduced by difficult windows. Left ventricular ejection fraction estimated by 2D at 60-65 percent. LA 3.3cm    Past Medical History:  Diagnosis Date   Arthritis    Costochondritis    improved with breast reduction surgery   Dizziness    orthostatic   Dysrhythmia    tachycardia related to POTS   Migraines    Neuromuscular disorder (HCC)    trigeminal nerve pain   Obesity    OSA (obstructive sleep apnea)    mild, improved with CPAP   PONV (postoperative nausea and vomiting)    POTS (postural orthostatic tachycardia syndrome)    Shortness of breath    on exertion   Sinus tachycardia     Past Surgical History:  Procedure Laterality Date   ABDOMINAL HYSTERECTOMY N/A 11/13/2012   Procedure: HYSTERECTOMY ABDOMINAL;  Surgeon: Jeani Hawking, MD;  Location: [facility];  Service: Gynecology;  Laterality: N/A;   bilateral breast reduction     bladder stem shortening     at age 8   BREAST BIOPSY Left 04/06/2023   [place] LT BREAST BX W LOC DEV 1ST LESION IMG BX SPEC US GUIDE 04/06/2023 [facility]   CARPAL TUNNEL RELEASE     REDUCTION MAMMAPLASTY Bilateral 2011   SALPINGOOPHORECTOMY Bilateral 11/13/2012   Procedure:  SALPINGO OOPHORECTOMY;  Surgeon: Jeani Hawking, MD;  Location: [facility];  Service: Gynecology;  Laterality: Bilateral;    MEDICATIONS: No current facility-administered medications for this encounter.    Ascorbic Acid (VITAMIN C PO)   omeprazole (PRILOSEC OTC) 20 MG tablet    Shonna Chock, PA-C Surgical Short Stay/Anesthesiology [facility] Phone [phone] [facility] Phone [phone] 04/19/2023 3:30 PM

## 2023-04-23 ENCOUNTER — Ambulatory Visit (HOSPITAL_COMMUNITY): Payer: BC Managed Care – PPO | Admitting: Vascular Surgery

## 2023-04-23 ENCOUNTER — Ambulatory Visit (HOSPITAL_COMMUNITY)
Admission: RE | Admit: 2023-04-23 | Discharge: 2023-04-23 | Disposition: A | Discharge status: Home / Self Care | Payer: BC Managed Care – PPO | Source: Ambulatory Visit | Attending: Surgery | Admitting: Surgery

## 2023-04-23 ENCOUNTER — Encounter (HOSPITAL_COMMUNITY)
Admission: RE | Disposition: A | Discharge status: Home / Self Care | Payer: Self-pay | Source: Ambulatory Visit | Attending: Surgery

## 2023-04-23 ENCOUNTER — Other Ambulatory Visit: Payer: Self-pay

## 2023-04-23 DIAGNOSIS — E66813 Obesity, class 3: Secondary | ICD-10-CM | POA: Insufficient documentation

## 2023-04-23 DIAGNOSIS — N611 Abscess of the breast and nipple: Secondary | ICD-10-CM | POA: Insufficient documentation

## 2023-04-23 DIAGNOSIS — K219 Gastro-esophageal reflux disease without esophagitis: Secondary | ICD-10-CM | POA: Diagnosis not present

## 2023-04-23 DIAGNOSIS — Z6841 Body Mass Index (BMI) 40.0 and over, adult: Secondary | ICD-10-CM | POA: Diagnosis not present

## 2023-04-23 DIAGNOSIS — G4733 Obstructive sleep apnea (adult) (pediatric): Secondary | ICD-10-CM | POA: Diagnosis not present

## 2023-04-23 DIAGNOSIS — Z90710 Acquired absence of both cervix and uterus: Secondary | ICD-10-CM | POA: Insufficient documentation

## 2023-04-23 HISTORY — DX: Other specified postprocedural states: Z98.890

## 2023-04-23 HISTORY — PX: INCISION AND DRAINAGE ABSCESS: SHX5864

## 2023-04-23 HISTORY — DX: Unspecified osteoarthritis, unspecified site: M19.90

## 2023-04-23 LAB — CBC
HCT: 44 % (ref 36.0–46.0)
Hemoglobin: 14.3 g/dL (ref 12.0–15.0)
MCH: 28.9 pg (ref 26.0–34.0)
MCHC: 32.5 g/dL (ref 30.0–36.0)
MCV: 89.1 fL (ref 80.0–100.0)
Platelets: 296 10*3/uL (ref 150–400)
RBC: 4.94 MIL/uL (ref 3.87–5.11)
RDW: 12.7 % (ref 11.5–15.5)
WBC: 7.3 10*3/uL (ref 4.0–10.5)
nRBC: 0 % (ref 0.0–0.2)

## 2023-04-23 LAB — BASIC METABOLIC PANEL
Anion gap: 10 (ref 5–15)
BUN: 14 mg/dL (ref 6–20)
CO2: 22 mmol/L (ref 22–32)
Calcium: 8.7 mg/dL — ABNORMAL LOW (ref 8.9–10.3)
Chloride: 105 mmol/L (ref 98–111)
Creatinine, Ser: 0.65 mg/dL (ref 0.44–1.00)
GFR, Estimated: >60 mL/min (ref >60)
Glucose, Bld: 88 mg/dL (ref 70–99)
Potassium: 4 mmol/L (ref 3.5–5.1)
Sodium: 137 mmol/L (ref 135–145)

## 2023-04-23 SURGERY — INCISION AND DRAINAGE, ABSCESS
Anesthesia: General | Site: Breast | Laterality: Left

## 2023-04-23 MED ORDER — HYDROMORPHONE HCL 1 MG/ML IJ SOLN
INTRAMUSCULAR | Status: DC | PRN
  Administered 2023-04-23: .5 mg via INTRAVENOUS

## 2023-04-23 MED ORDER — 0.9 % SODIUM CHLORIDE (POUR BTL) OPTIME
TOPICAL | Status: DC | PRN
  Administered 2023-04-23: 1000 mL

## 2023-04-23 MED ORDER — LIDOCAINE 2% (20 MG/ML) 5 ML SYRINGE
INTRAMUSCULAR | Status: DC | PRN
  Administered 2023-04-23: 100 mg via INTRAVENOUS

## 2023-04-23 MED ORDER — MIDAZOLAM HCL 2 MG/2ML IJ SOLN
INTRAMUSCULAR | Status: DC | PRN
  Administered 2023-04-23: 2 mg via INTRAVENOUS

## 2023-04-23 MED ORDER — PROPOFOL 10 MG/ML IV BOLUS
INTRAVENOUS | Status: AC
  Filled 2023-04-23: qty 20

## 2023-04-23 MED ORDER — DROPERIDOL 2.5 MG/ML IJ SOLN: 0.6250 mg | Freq: Once | INTRAMUSCULAR | Status: DC | PRN

## 2023-04-23 MED ORDER — ONDANSETRON HCL 4 MG/2ML IJ SOLN
INTRAMUSCULAR | Status: AC
  Filled 2023-04-23: qty 2

## 2023-04-23 MED ORDER — ACETAMINOPHEN 500 MG PO TABS: 1000.0000 mg | ORAL_TABLET | ORAL | Status: DC

## 2023-04-23 MED ORDER — ORAL CARE MOUTH RINSE: 15.0000 mL | Freq: Once | OROMUCOSAL | Status: AC

## 2023-04-23 MED ORDER — OXYCODONE HCL 5 MG PO TABS
ORAL_TABLET | ORAL | Status: AC
  Filled 2023-04-23: qty 1

## 2023-04-23 MED ORDER — ONDANSETRON HCL 4 MG/2ML IJ SOLN
INTRAMUSCULAR | Status: DC | PRN
  Administered 2023-04-23: 4 mg via INTRAVENOUS

## 2023-04-23 MED ORDER — SCOPOLAMINE 1 MG/3DAYS TD PT72
1.0000 | MEDICATED_PATCH | TRANSDERMAL | Status: DC
  Administered 2023-04-23: 1.5 mg via TRANSDERMAL
  Filled 2023-04-23: qty 1

## 2023-04-23 MED ORDER — PROPOFOL 10 MG/ML IV BOLUS
INTRAVENOUS | Status: DC | PRN
  Administered 2023-04-23: 200 mg via INTRAVENOUS

## 2023-04-23 MED ORDER — CHLORHEXIDINE GLUCONATE CLOTH 2 % EX PADS
6.0000 | MEDICATED_PAD | Freq: Once | CUTANEOUS | Status: AC
  Administered 2023-04-23: 6 via TOPICAL

## 2023-04-23 MED ORDER — HYDROMORPHONE HCL 1 MG/ML IJ SOLN
INTRAMUSCULAR | Status: AC
  Filled 2023-04-23: qty 0.5

## 2023-04-23 MED ORDER — BUPIVACAINE-EPINEPHRINE (PF) 0.25% -1:200000 IJ SOLN
INTRAMUSCULAR | Status: AC
  Filled 2023-04-23: qty 30

## 2023-04-23 MED ORDER — LACTATED RINGERS IV SOLN: INTRAVENOUS | Status: DC | PRN

## 2023-04-23 MED ORDER — CEFAZOLIN SODIUM-DEXTROSE 2-3 GM-%(50ML) IV SOLR
INTRAVENOUS | Status: DC | PRN
Start: 2023-04-23 — End: 2023-04-23
  Administered 2023-04-23: 2 g via INTRAVENOUS

## 2023-04-23 MED ORDER — DEXAMETHASONE SODIUM PHOSPHATE 10 MG/ML IJ SOLN
INTRAMUSCULAR | Status: AC
  Filled 2023-04-23: qty 1

## 2023-04-23 MED ORDER — FENTANYL CITRATE (PF) 250 MCG/5ML IJ SOLN
INTRAMUSCULAR | Status: AC
  Filled 2023-04-23: qty 5

## 2023-04-23 MED ORDER — MEPERIDINE HCL 25 MG/ML IJ SOLN: 6.2500 mg | INTRAMUSCULAR | Status: DC | PRN

## 2023-04-23 MED ORDER — ACETAMINOPHEN 500 MG PO TABS
1000.0000 mg | ORAL_TABLET | Freq: Once | ORAL | Status: AC
  Administered 2023-04-23: 1000 mg via ORAL
  Filled 2023-04-23: qty 2

## 2023-04-23 MED ORDER — PROPOFOL 500 MG/50ML IV EMUL
INTRAVENOUS | Status: DC | PRN
  Administered 2023-04-23: 150 ug/kg/min via INTRAVENOUS

## 2023-04-23 MED ORDER — HYDROCODONE-ACETAMINOPHEN 5-325 MG PO TABS: 1.0000 | ORAL_TABLET | Freq: Four times a day (QID) | ORAL | 0 refills | Status: AC | PRN

## 2023-04-23 MED ORDER — OXYCODONE HCL 5 MG PO TABS
5.0000 mg | ORAL_TABLET | Freq: Once | ORAL | Status: AC | PRN
  Administered 2023-04-23: 5 mg via ORAL

## 2023-04-23 MED ORDER — MIDAZOLAM HCL 2 MG/2ML IJ SOLN
INTRAMUSCULAR | Status: AC
  Filled 2023-04-23: qty 2

## 2023-04-23 MED ORDER — OXYCODONE HCL 5 MG/5ML PO SOLN
5.0000 mg | Freq: Once | ORAL | Status: AC | PRN
Start: 2023-04-23 — End: 2023-04-23

## 2023-04-23 MED ORDER — PHENYLEPHRINE HCL (PRESSORS) 10 MG/ML IV SOLN
INTRAVENOUS | Status: DC | PRN
  Administered 2023-04-23 (×2): 80 ug via INTRAVENOUS

## 2023-04-23 MED ORDER — HYDROMORPHONE HCL 1 MG/ML IJ SOLN: 0.2500 mg | INTRAMUSCULAR | Status: DC | PRN

## 2023-04-23 MED ORDER — DEXMEDETOMIDINE HCL IN NACL 200 MCG/50ML IV SOLN
INTRAVENOUS | Status: DC | PRN
  Administered 2023-04-23: 12 ug via INTRAVENOUS

## 2023-04-23 MED ORDER — DEXAMETHASONE SODIUM PHOSPHATE 10 MG/ML IJ SOLN
INTRAMUSCULAR | Status: DC | PRN
  Administered 2023-04-23: 10 mg via INTRAVENOUS

## 2023-04-23 MED ORDER — CEFAZOLIN IN SODIUM CHLORIDE 3-0.9 GM/100ML-% IV SOLN
3.0000 g | INTRAVENOUS | Status: DC
  Filled 2023-04-23: qty 100

## 2023-04-23 MED ORDER — CHLORHEXIDINE GLUCONATE 0.12 % MT SOLN
15.0000 mL | Freq: Once | OROMUCOSAL | Status: AC
Start: 2023-04-23 — End: 2023-04-23
  Administered 2023-04-23: 15 mL via OROMUCOSAL
  Filled 2023-04-23: qty 15

## 2023-04-23 MED ORDER — GABAPENTIN 300 MG PO CAPS
300.0000 mg | ORAL_CAPSULE | Freq: Once | ORAL | Status: AC
  Administered 2023-04-23: 300 mg via ORAL
  Filled 2023-04-23: qty 1

## 2023-04-23 MED ORDER — DEXMEDETOMIDINE HCL IN NACL 80 MCG/20ML IV SOLN
INTRAVENOUS | Status: AC
  Filled 2023-04-23: qty 20

## 2023-04-23 MED ORDER — CHLORHEXIDINE GLUCONATE CLOTH 2 % EX PADS: 6.0000 | MEDICATED_PAD | Freq: Once | CUTANEOUS | Status: DC

## 2023-04-23 MED ORDER — BUPIVACAINE-EPINEPHRINE 0.25% -1:200000 IJ SOLN
INTRAMUSCULAR | Status: DC | PRN
  Administered 2023-04-23: 10 mL

## 2023-04-23 SURGICAL SUPPLY — 23 items
BAG COUNTER SPONGE SURGICOUNT (BAG) ×2 IMPLANT
CANISTER SUCT 3000ML PPV (MISCELLANEOUS) ×2 IMPLANT
CHLORAPREP W/TINT 26 (MISCELLANEOUS) IMPLANT
COVER SURGICAL LIGHT HANDLE (MISCELLANEOUS) ×2 IMPLANT
DRAIN PENROSE 0.25X18 (DRAIN) IMPLANT
DRAPE LAPAROSCOPIC ABDOMINAL (DRAPES) IMPLANT
ELECT CAUTERY BLADE 6.4 (BLADE) IMPLANT
ELECT REM PT RETURN 9FT ADLT (ELECTROSURGICAL) ×1 IMPLANT
ELECTRODE REM PT RTRN 9FT ADLT (ELECTROSURGICAL) ×2 IMPLANT
GAUZE PAD ABD 8X10 STRL (GAUZE/BANDAGES/DRESSINGS) IMPLANT
GAUZE SPONGE 4X4 12PLY STRL (GAUZE/BANDAGES/DRESSINGS) IMPLANT
GLOVE BIO SURGEON STRL SZ7 (GLOVE) ×2 IMPLANT
GLOVE BIOGEL PI IND STRL 7.5 (GLOVE) ×2 IMPLANT
GOWN STRL REUS W/ TWL LRG LVL3 (GOWN DISPOSABLE) ×4 IMPLANT
KIT BASIN OR (CUSTOM PROCEDURE TRAY) ×2 IMPLANT
KIT TURNOVER KIT B (KITS) ×2 IMPLANT
NS IRRIG 1000ML POUR BTL (IV SOLUTION) ×2 IMPLANT
PACK GENERAL/GYN (CUSTOM PROCEDURE TRAY) ×2 IMPLANT
PAD ARMBOARD 7.5X6 YLW CONV (MISCELLANEOUS) ×2 IMPLANT
PENCIL SMOKE EVACUATOR (MISCELLANEOUS) ×2 IMPLANT
SUT ETHILON 2 0 FS 18 (SUTURE) IMPLANT
TOWEL GREEN STERILE (TOWEL DISPOSABLE) ×2 IMPLANT
TOWEL GREEN STERILE FF (TOWEL DISPOSABLE) ×2 IMPLANT

## 2023-04-23 NOTE — Op Note (Addendum)
Pre-op diagnosis: Left breast abscess Postop diagnosis: Same Procedure performed: Incision and drainage of left breast abscess Surgeon:Berma Harts K Nevaeh Korte Anesthesia: General Indications: This is a 47 year old female status post remote history of bilateral reduction mammoplasty who presents with a 4-week history of subareolar left breast erythema and tenderness.  She was treated with 2 separate courses of antibiotics with minimal improvement.  Diagnostic mammogram and ultrasound on 11/14 showed cellulitis.  Follow-up ultrasound on 04/06/2023 revealed small pockets of fluid and hyperemic tissue.  These are located at 2:00 in the left breast 2 cm from the nipple.  She underwent aspiration as well as tissue sampling.  Tissue sampling showed only inflamed breast tissue.  The aspiration showed no growth.  The area continues to be erythematous and tender.  She has begun to have some spontaneous purulent drainage.  However the mass continues to feel like it is about 5 cm across.  Currently she is not on any antibiotics.  Operative findings: The patient has a 2.5 cm area of necrotic skin that was excised.  She has a 4 cm abscess cavity below and lateral to the necrotic skin.    CASE DATA:  Type of patient?:  Urgent case - [facility]  Status of Case? Elective Scheduled  Infection Present At Time Of Surgery (PATOS)?  ABSCESS in the left breast  Description of procedure: The patient is brought to the operating room placed in the supine position on the operating room table.  After an adequate level general anesthesia was obtained, her left breast was prepped with ChloraPrep and draped sterile fashion.  A timeout was taken to ensure the proper patient and proper procedure.  The patient has a 2.5 cm diameter area of necrotic skin.  We infiltrated around the entire area with local anesthetic.  I excised the necrotic skin.  We excised some of the necrotic tissue just underneath the skin.  We entered a large abscess cavity  laterally.  We evacuated all the purulent fluid.  We broke up any loculations.  The abscess cavity is 4 cm in diameter.  We irrigated thoroughly and inspected for hemostasis.  I placed 1/4 inch Penrose drain deep into the wound.  We secured this with 2-0 nylon.  2-0 nylon was used to loosely close the skin incision.  We used a total of 4 interrupted sutures.  A dry dressing is applied.  The patient is then extubated and brought to recovery in stable condition.  All sponge, instrument, and needle counts are correct.  Wilmon Arms. Corliss Skains, MD, [facility]  General Surgery   04/23/2023 8:13 AM

## 2023-04-23 NOTE — Anesthesia Postprocedure Evaluation (Signed)
Anesthesia Post Note  Patient: Julia Haas  Procedure(s) Performed: INCISION AND DRAINAGE OF LEFT BREAST ABSCESS (Left: Breast)     Patient location during evaluation: PACU Anesthesia Type: General Level of consciousness: sedated and patient cooperative Pain management: pain level controlled Vital Signs Assessment: post-procedure vital signs reviewed and stable Respiratory status: spontaneous breathing Cardiovascular status: stable Anesthetic complications: no   No notable events documented.  Last Vitals:  Vitals:   04/23/23 0830 04/23/23 0845  BP: 110/81 95/68  Pulse: 69 82  Resp: 12 16  Temp:    SpO2: 93% 92%    Last Pain:  Vitals:   04/23/23 0900  TempSrc:   PainSc: 2                  Lewie Loron

## 2023-04-23 NOTE — Anesthesia Procedure Notes (Signed)
Procedure Name: Intubation Date/Time: 04/23/2023 7:44 AM  Performed by: Hali Marry, CRNAPre-anesthesia Checklist: Patient identified, Emergency Drugs available, Suction available and Patient being monitored Patient Re-evaluated:Patient Re-evaluated prior to induction Oxygen Delivery Method: Circle system utilized Preoxygenation: Pre-oxygenation with 100% oxygen Induction Type: IV induction Ventilation: Mask ventilation without difficulty LMA: LMA inserted LMA Size: 4.0 Tube type: Oral Number of attempts: 1 Airway Equipment and Method: Oral airway Placement Confirmation: ETT inserted through vocal cords under direct vision, positive ETCO2 and breath sounds checked- equal and bilateral Tube secured with: Tape Dental Injury: Teeth and Oropharynx as per pre-operative assessment

## 2023-04-23 NOTE — Interval H&P Note (Signed)
History and Physical Interval Note:  04/23/2023 7:14 AM  Julia Haas  has presented today for surgery, with the diagnosis of LEFT BREAST ABSCESS.  The various methods of treatment have been discussed with the patient and family. After consideration of risks, benefits and other options for treatment, the patient has consented to  Procedure(s) with comments: INCISION AND DRAINAGE OF LEFT BREAST ABSCESS (Left) - LMA as a surgical intervention.  The patient's history has been reviewed, patient examined, no change in status, stable for surgery.  I have reviewed the patient's chart and labs.  Questions were answered to the patient's satisfaction.     Wynona Luna

## 2023-04-23 NOTE — Transfer of Care (Signed)
Immediate Anesthesia Transfer of Care Note  Patient: Julia Haas  Procedure(s) Performed: INCISION AND DRAINAGE OF LEFT BREAST ABSCESS (Left: Breast)  Patient Location: PACU  Anesthesia Type:General  Level of Consciousness: awake, alert , and oriented  Airway & Oxygen Therapy: Patient Spontanous Breathing  Post-op Assessment: Report given to RN and Post -op Vital signs reviewed and stable  Post vital signs: Reviewed and stable  Last Vitals:  Vitals Value Taken Time  BP 110/81 04/23/23 0830  Temp 36.6 C 04/23/23 0820  Pulse 76 04/23/23 0830  Resp 13 04/23/23 0830  SpO2 92 % 04/23/23 0830  Vitals shown include unfiled device data.  Last Pain:  Vitals:   04/23/23 0820  TempSrc:   PainSc: 0-No pain      Patients Stated Pain Goal: 1 (04/23/23 1610)  Complications: No notable events documented.

## 2023-04-24 ENCOUNTER — Encounter (HOSPITAL_COMMUNITY): Payer: Self-pay | Admitting: Surgery

## 2023-04-24 LAB — SURGICAL PATHOLOGY
